# Patient Record
Sex: Female | Born: 1950
Health system: Southern US, Community
[De-identification: ages and names within clinical notes are randomized; demographics above are authoritative.]

## PROBLEM LIST (undated history)

## (undated) DIAGNOSIS — E7800 Pure hypercholesterolemia, unspecified: Secondary | ICD-10-CM

## (undated) DIAGNOSIS — C50919 Malignant neoplasm of unspecified site of unspecified female breast: Secondary | ICD-10-CM

## (undated) DIAGNOSIS — E119 Type 2 diabetes mellitus without complications: Secondary | ICD-10-CM

## (undated) DIAGNOSIS — I10 Essential (primary) hypertension: Secondary | ICD-10-CM

## (undated) DIAGNOSIS — K219 Gastro-esophageal reflux disease without esophagitis: Secondary | ICD-10-CM

## (undated) HISTORY — DX: Essential (primary) hypertension: I10

## (undated) HISTORY — PX: TUBAL LIGATION: SHX77

## (undated) HISTORY — DX: Type 2 diabetes mellitus without complications: E11.9

## (undated) HISTORY — DX: Malignant neoplasm of unspecified site of unspecified female breast: C50.919

## (undated) HISTORY — DX: Pure hypercholesterolemia, unspecified: E78.00

## (undated) HISTORY — DX: Gastro-esophageal reflux disease without esophagitis: K21.9

## (undated) HISTORY — PX: ANKLE ARTHROPLASTY: SUR68

---

## 2010-05-03 HISTORY — PX: ANKLE ARTHROPLASTY: SUR68

## 2016-01-26 ENCOUNTER — Encounter: Payer: Self-pay | Admitting: Hematology

## 2016-05-03 DIAGNOSIS — Z923 Personal history of irradiation: Secondary | ICD-10-CM

## 2016-05-03 HISTORY — DX: Personal history of irradiation: Z92.3

## 2016-10-13 ENCOUNTER — Encounter: Payer: Self-pay | Admitting: Hematology

## 2016-10-28 ENCOUNTER — Encounter: Payer: Self-pay | Admitting: Hematology

## 2016-10-28 DIAGNOSIS — C50919 Malignant neoplasm of unspecified site of unspecified female breast: Secondary | ICD-10-CM

## 2016-10-28 HISTORY — DX: Malignant neoplasm of unspecified site of unspecified female breast: C50.919

## 2016-10-28 HISTORY — PX: BREAST LUMPECTOMY: SHX2

## 2016-11-17 ENCOUNTER — Encounter: Payer: Self-pay | Admitting: Hematology

## 2017-05-20 DIAGNOSIS — I781 Nevus, non-neoplastic: Secondary | ICD-10-CM | POA: Diagnosis not present

## 2017-05-20 DIAGNOSIS — L821 Other seborrheic keratosis: Secondary | ICD-10-CM | POA: Diagnosis not present

## 2017-05-20 DIAGNOSIS — L814 Other melanin hyperpigmentation: Secondary | ICD-10-CM | POA: Diagnosis not present

## 2017-05-20 DIAGNOSIS — D1801 Hemangioma of skin and subcutaneous tissue: Secondary | ICD-10-CM | POA: Diagnosis not present

## 2017-06-01 DIAGNOSIS — N6459 Other signs and symptoms in breast: Secondary | ICD-10-CM | POA: Diagnosis not present

## 2017-06-01 DIAGNOSIS — E7800 Pure hypercholesterolemia, unspecified: Secondary | ICD-10-CM | POA: Diagnosis not present

## 2017-06-01 DIAGNOSIS — E559 Vitamin D deficiency, unspecified: Secondary | ICD-10-CM | POA: Diagnosis not present

## 2017-06-01 DIAGNOSIS — K210 Gastro-esophageal reflux disease with esophagitis: Secondary | ICD-10-CM | POA: Diagnosis not present

## 2017-06-01 DIAGNOSIS — E119 Type 2 diabetes mellitus without complications: Secondary | ICD-10-CM | POA: Diagnosis not present

## 2017-06-01 DIAGNOSIS — I10 Essential (primary) hypertension: Secondary | ICD-10-CM | POA: Diagnosis not present

## 2017-06-01 DIAGNOSIS — Z0000 Encounter for general adult medical examination without abnormal findings: Secondary | ICD-10-CM | POA: Diagnosis not present

## 2017-06-01 DIAGNOSIS — Z6830 Body mass index (BMI) 30.0-30.9, adult: Secondary | ICD-10-CM | POA: Diagnosis not present

## 2017-06-16 DIAGNOSIS — Z1212 Encounter for screening for malignant neoplasm of rectum: Secondary | ICD-10-CM | POA: Diagnosis not present

## 2017-06-16 DIAGNOSIS — Z1211 Encounter for screening for malignant neoplasm of colon: Secondary | ICD-10-CM | POA: Diagnosis not present

## 2017-06-30 DIAGNOSIS — Z79811 Long term (current) use of aromatase inhibitors: Secondary | ICD-10-CM | POA: Diagnosis not present

## 2017-06-30 DIAGNOSIS — C50411 Malignant neoplasm of upper-outer quadrant of right female breast: Secondary | ICD-10-CM | POA: Diagnosis not present

## 2017-07-05 ENCOUNTER — Encounter: Payer: Self-pay | Admitting: Hematology

## 2017-07-05 DIAGNOSIS — N6002 Solitary cyst of left breast: Secondary | ICD-10-CM | POA: Diagnosis not present

## 2017-07-05 DIAGNOSIS — Z853 Personal history of malignant neoplasm of breast: Secondary | ICD-10-CM | POA: Diagnosis not present

## 2017-07-05 DIAGNOSIS — R921 Mammographic calcification found on diagnostic imaging of breast: Secondary | ICD-10-CM | POA: Diagnosis not present

## 2017-07-05 DIAGNOSIS — N6011 Diffuse cystic mastopathy of right breast: Secondary | ICD-10-CM | POA: Diagnosis not present

## 2017-07-06 DIAGNOSIS — E7800 Pure hypercholesterolemia, unspecified: Secondary | ICD-10-CM | POA: Diagnosis not present

## 2017-07-06 DIAGNOSIS — E119 Type 2 diabetes mellitus without complications: Secondary | ICD-10-CM | POA: Diagnosis not present

## 2017-07-06 DIAGNOSIS — Z0000 Encounter for general adult medical examination without abnormal findings: Secondary | ICD-10-CM | POA: Diagnosis not present

## 2017-08-17 DIAGNOSIS — R945 Abnormal results of liver function studies: Secondary | ICD-10-CM | POA: Diagnosis not present

## 2017-08-30 DIAGNOSIS — C50912 Malignant neoplasm of unspecified site of left female breast: Secondary | ICD-10-CM | POA: Diagnosis not present

## 2017-10-07 DIAGNOSIS — C50411 Malignant neoplasm of upper-outer quadrant of right female breast: Secondary | ICD-10-CM | POA: Diagnosis not present

## 2017-10-07 DIAGNOSIS — Z79811 Long term (current) use of aromatase inhibitors: Secondary | ICD-10-CM | POA: Diagnosis not present

## 2017-11-29 DIAGNOSIS — E7800 Pure hypercholesterolemia, unspecified: Secondary | ICD-10-CM | POA: Diagnosis not present

## 2017-11-29 DIAGNOSIS — K210 Gastro-esophageal reflux disease with esophagitis: Secondary | ICD-10-CM | POA: Diagnosis not present

## 2017-11-29 DIAGNOSIS — E559 Vitamin D deficiency, unspecified: Secondary | ICD-10-CM | POA: Diagnosis not present

## 2017-11-29 DIAGNOSIS — I10 Essential (primary) hypertension: Secondary | ICD-10-CM | POA: Diagnosis not present

## 2017-11-29 DIAGNOSIS — N6459 Other signs and symptoms in breast: Secondary | ICD-10-CM | POA: Diagnosis not present

## 2017-11-29 DIAGNOSIS — Z6827 Body mass index (BMI) 27.0-27.9, adult: Secondary | ICD-10-CM | POA: Diagnosis not present

## 2017-11-29 DIAGNOSIS — E119 Type 2 diabetes mellitus without complications: Secondary | ICD-10-CM | POA: Diagnosis not present

## 2018-03-01 ENCOUNTER — Encounter (HOSPITAL_COMMUNITY): Payer: Self-pay | Admitting: Hematology

## 2018-03-01 ENCOUNTER — Inpatient Hospital Stay (HOSPITAL_COMMUNITY): Payer: Medicare Other | Attending: Hematology | Admitting: Hematology

## 2018-03-01 ENCOUNTER — Inpatient Hospital Stay (HOSPITAL_COMMUNITY): Payer: Medicare Other

## 2018-03-01 VITALS — BP 151/84 | HR 82 | Temp 98.2°F | Resp 18 | Ht 62.0 in | Wt 144.5 lb

## 2018-03-01 DIAGNOSIS — Z803 Family history of malignant neoplasm of breast: Secondary | ICD-10-CM | POA: Diagnosis not present

## 2018-03-01 DIAGNOSIS — Z8249 Family history of ischemic heart disease and other diseases of the circulatory system: Secondary | ICD-10-CM | POA: Insufficient documentation

## 2018-03-01 DIAGNOSIS — Z170 Estrogen receptor positive status [ER+]: Secondary | ICD-10-CM | POA: Diagnosis not present

## 2018-03-01 DIAGNOSIS — Z79899 Other long term (current) drug therapy: Secondary | ICD-10-CM | POA: Diagnosis not present

## 2018-03-01 DIAGNOSIS — I10 Essential (primary) hypertension: Secondary | ICD-10-CM | POA: Diagnosis not present

## 2018-03-01 DIAGNOSIS — K219 Gastro-esophageal reflux disease without esophagitis: Secondary | ICD-10-CM | POA: Diagnosis not present

## 2018-03-01 DIAGNOSIS — Z79811 Long term (current) use of aromatase inhibitors: Secondary | ICD-10-CM | POA: Diagnosis not present

## 2018-03-01 DIAGNOSIS — Z853 Personal history of malignant neoplasm of breast: Secondary | ICD-10-CM

## 2018-03-01 DIAGNOSIS — E119 Type 2 diabetes mellitus without complications: Secondary | ICD-10-CM | POA: Insufficient documentation

## 2018-03-01 DIAGNOSIS — Z923 Personal history of irradiation: Secondary | ICD-10-CM | POA: Insufficient documentation

## 2018-03-01 DIAGNOSIS — Z7984 Long term (current) use of oral hypoglycemic drugs: Secondary | ICD-10-CM | POA: Diagnosis not present

## 2018-03-01 DIAGNOSIS — C50411 Malignant neoplasm of upper-outer quadrant of right female breast: Secondary | ICD-10-CM | POA: Diagnosis not present

## 2018-03-01 LAB — COMPREHENSIVE METABOLIC PANEL
ALK PHOS: 49 U/L (ref 38–126)
ALT: 28 U/L (ref 0–44)
AST: 36 U/L (ref 15–41)
Albumin: 4.7 g/dL (ref 3.5–5.0)
Anion gap: 11 (ref 5–15)
BUN: 16 mg/dL (ref 8–23)
CALCIUM: 9.4 mg/dL (ref 8.9–10.3)
CO2: 27 mmol/L (ref 22–32)
CREATININE: 0.72 mg/dL (ref 0.44–1.00)
Chloride: 103 mmol/L (ref 98–111)
GFR calc non Af Amer: >60 mL/min (ref >60)
Glucose, Bld: 85 mg/dL (ref 70–99)
Potassium: 3.8 mmol/L (ref 3.5–5.1)
Sodium: 141 mmol/L (ref 135–145)
Total Bilirubin: 0.9 mg/dL (ref 0.3–1.2)
Total Protein: 8.1 g/dL (ref 6.5–8.1)

## 2018-03-01 LAB — CBC WITH DIFFERENTIAL/PLATELET
ABS IMMATURE GRANULOCYTES: 0.02 10*3/uL (ref 0.00–0.07)
Basophils Absolute: 0 10*3/uL (ref 0.0–0.1)
Basophils Relative: 1 %
Eosinophils Absolute: 0.1 10*3/uL (ref 0.0–0.5)
Eosinophils Relative: 1 %
HEMATOCRIT: 43.4 % (ref 36.0–46.0)
Hemoglobin: 14.1 g/dL (ref 12.0–15.0)
Immature Granulocytes: 0 %
Lymphocytes Relative: 25 %
Lymphs Abs: 1.9 10*3/uL (ref 0.7–4.0)
MCH: 29.3 pg (ref 26.0–34.0)
MCHC: 32.5 g/dL (ref 30.0–36.0)
MCV: 90.2 fL (ref 80.0–100.0)
MONO ABS: 0.6 10*3/uL (ref 0.1–1.0)
Monocytes Relative: 8 %
NEUTROS ABS: 5.1 10*3/uL (ref 1.7–7.7)
Neutrophils Relative %: 65 %
Platelets: 277 10*3/uL (ref 150–400)
RBC: 4.81 MIL/uL (ref 3.87–5.11)
RDW: 13.8 % (ref 11.5–15.5)
WBC: 7.6 10*3/uL (ref 4.0–10.5)
nRBC: 0 % (ref 0.0–0.2)

## 2018-03-01 NOTE — Progress Notes (Signed)
[facility] NOTE  Patient Care Team: Burdine, Virgina Evener, MD as PCP - General (Family Medicine)  CHIEF COMPLAINTS/PURPOSE OF CONSULTATION:  To establish care for her right breast cancer.  HISTORY OF PRESENTING ILLNESS:  Laura Oliver 67 y.o. female is seen in consultation today for establishing care for her right breast cancer.  She recently moved to [place] from [place] on 12/24/2017.  She was diagnosed with left breast cancer in 2018, for which she underwent lumpectomy and sentinel lymph node biopsy on 10/28/2016.  This showed 2 foci of cancer 0.9 cm and 0.4 cm, ER/PR positive and HER-2 negative.  She underwent 16 sessions of radiation therapy followed by anastrozole.  She is tolerating anastrozole very well.  She denies any joint pains.  No muscular pains were noted.  She did have genetic testing for BRCA1/2 done which were negative.  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  Her daughter was diagnosed with breast cancer at age 63.  She was also tested negative for BRCA mutation.  Maternal aunt reportedly had breast cancer in her 37s and died of it.  Otherwise she is very active weekly.  Energy and appetite are reported as 100%.  I reviewed her records extensively and collaborated the history with the patient.    MEDICAL HISTORY:  Past Medical History:  Diagnosis Date  . Breast cancer ([place]) 10/28/2016   left  . Diabetes mellitus without complication ([place])   . GERD (gastroesophageal reflux disease)   . High cholesterol   . Hypertension     SURGICAL HISTORY: Past Surgical History:  Procedure Laterality Date  . ANKLE ARTHROPLASTY    . BREAST LUMPECTOMY Left 10/28/2016  . TUBAL LIGATION      SOCIAL HISTORY: Social History   Socioeconomic History  . Marital status: Married    Spouse name: Not on file  . Number of children: Not on file  . Years of education: Not on file  . Highest education level: Not on file  Occupational  History  . Occupation: retired  Scientific laboratory technician  . Financial resource strain: Not hard at all  . Food insecurity:    Worry: Never true    Inability: Never true  . Transportation needs:    Medical: No    Non-medical: No  Tobacco Use  . Smoking status: Never Smoker  . Smokeless tobacco: Never Used  Substance and Sexual Activity  . Alcohol use: Yes    Frequency: Never    Comment: prn beer  . Drug use: Never  . Sexual activity: Not on file  Lifestyle  . Physical activity:    Days per week: Not on file    Minutes per session: Not on file  . Stress: Not at all  Relationships  . Social connections:    Talks on phone: Not on file    Gets together: Not on file    Attends religious service: Not on file    Active member of club or organization: Not on file    Attends meetings of clubs or organizations: Not on file    Relationship status: Not on file  . Intimate partner violence:    Fear of current or ex partner: Not on file    Emotionally abused: Not on file    Physically abused: Not on file    Forced sexual activity: Not on file  Other Topics Concern  . Not on file  Social History Narrative   From [place]  [place]   Recently retired and moved to [place] to be closer to her family.     FAMILY HISTORY: Family History  Problem Relation Age of Onset  . Cancer Daughter        breast  . Rheumatic fever Father   . Diabetes Brother   . Hypertension Brother     ALLERGIES:  has no allergies on file.  MEDICATIONS:  Current Outpatient Medications  Medication Sig Dispense Refill  . alendronate (FOSAMAX) 70 MG tablet Take 70 mg by mouth once a week. Take with a full glass of water on an empty stomach.    Marland Kitchen anastrozole (ARIMIDEX) 1 MG tablet Take 1 mg by mouth daily.    . Calcium Carbonate (CALTRATE 600 PO) Take 1 tablet by mouth.    . Cholecalciferol (VITAMIN D3) 2000 units TABS Take 1 tablet by mouth.    . ezetimibe-simvastatin (VYTORIN) 10-40 MG tablet Take 1 tablet by mouth daily.     . metFORMIN (GLUCOPHAGE) 500 MG tablet Take by mouth daily with breakfast.    . Multiple Vitamins-Minerals (WOMENS MULTI VITAMIN & MINERAL PO) Take 1 tablet by mouth.    Marland Kitchen omeprazole (PRILOSEC) 20 MG capsule Take 20 mg by mouth daily.    . valsartan-hydrochlorothiazide (DIOVAN-HCT) 320-12.5 MG tablet Take 1 tablet by mouth daily.     No current facility-administered medications for this visit.     REVIEW OF SYSTEMS:   Constitutional: Denies fevers, chills or abnormal night sweats Eyes: Denies blurriness of vision, double vision or watery eyes Ears, nose, mouth, throat, and face: Denies mucositis or sore throat Respiratory: Denies cough, dyspnea or wheezes Cardiovascular: Denies palpitation, chest discomfort or lower extremity swelling Gastrointestinal:  Denies nausea, heartburn or change in bowel habits Skin: Denies abnormal skin rashes Lymphatics: Denies new lymphadenopathy or easy bruising Neurological:Denies numbness, tingling or new weaknesses Behavioral/Psych: Mood is stable, no new changes  Breast:  Denies any palpable lumps or discharge All other systems were reviewed with the patient and are negative.  PHYSICAL EXAMINATION: ECOG PERFORMANCE STATUS: 1 - Symptomatic but completely ambulatory  Vitals:   03/01/18 1257  BP: (!) 151/84  Pulse: 82  Resp: 18  Temp: 98.2 F (36.8 C)  SpO2: 100%   Filed Weights   03/01/18 1257  Weight: 144 lb 8 oz (65.5 kg)    GENERAL:alert, no distress and comfortable SKIN: skin color, texture, turgor are normal, no rashes or significant lesions EYES: normal, conjunctiva are pink and non-injected, sclera clear OROPHARYNX:no exudate, no erythema and lips, buccal mucosa, and tongue normal  NECK: supple, thyroid normal size, non-tender, without nodularity LYMPH:  no palpable lymphadenopathy in the cervical, axillary or inguinal LUNGS: clear to auscultation and percussion with normal breathing effort HEART: regular rate & rhythm and no  murmurs and no lower extremity edema ABDOMEN:abdomen soft, non-tender and normal bowel sounds Musculoskeletal:no cyanosis of digits and no clubbing  PSYCH: alert & oriented x 3 with fluent speech NEURO: no focal motor/sensory deficits BREAST: Lumpectomy scar in the upper outer quadrant is well-healed.  No palpable mass in bilateral breast.  No palpable adenopathy.  LABORATORY DATA:  I have reviewed the data as listed Lab Results  Component Value Date   WBC 7.6 03/01/2018   HGB 14.1 03/01/2018   HCT 43.4 03/01/2018   MCV 90.2 03/01/2018   PLT 277 03/01/2018   Lab Results  Component Value Date   NA 141 03/01/2018   K 3.8 03/01/2018   CL 103 03/01/2018  CO2 27 03/01/2018    RADIOGRAPHIC STUDIES: I have personally reviewed the radiological reports and agreed with the findings in the report.  ASSESSMENT AND PLAN:  History of right breast cancer 1.  Right breast cancer (PT 1BN0), ER/PR positive, HER-2 negative: - Abnormal mammogram in April 2018, biopsy on 09/30/2016 of the left breast at 12:00, consistent with moderately differentiated IDC, ER/PR positive, HER-2 negative - MR showed 11 o'clock position left breast, 1.6 x 1.4 x 2.4 cm irregularly bordered enhancing mass. - She underwent left lumpectomy and sentinel lymph node biopsy on 10/28/2016. -Pathology showed moderately differentiated IDC, 0.9 cm associated with prior biopsy site.  An incidental well-differentiated IDC, 0.4 cm located about 5 mm posterior to the first lesion.  Low-grade DCIS was seen.  3 sentinel lymph nodes were negative.  Margins were negative. - She received 16 radiation treatments at [facility] breast center in [place]. -She was started on anastrozole on 12/24/2017.  Myread my risk panel on 10/13/2016 was negative for BRCA and other genes. -She is tolerating anastrozole very well. -Today's physical examination shows left lumpectomy scar within normal limits with no palpable masses or adenopathy. -We  will schedule her for bilateral diagnostic mammogram.  We have done labs in our office and the chemistries were normal. -She will have follow-up visits once every 4 months during the first 3 years followed by once every 6 months during years 4 and 5.  I did counsel her about exercises 30 minutes daily, most days of the week.  2.  Osteoporosis: - I will obtain the reports of DEXA scan from last year. -She is on Fosamax 70 mg weekly which she is tolerating it very well.  She is also taking calcium and vitamin D supplements. -I have recommended weightbearing exercises.   All questions were answered. The patient knows to call the clinic with any problems, questions or concerns.    Derek Jack, MD 03/01/18

## 2018-03-01 NOTE — Patient Instructions (Signed)
[facility] at [facility] Discharge Instructions     Thank you for choosing [facility] at [facility] to provide your oncology and hematology care.  To afford each patient quality time with our provider, please arrive at least 15 minutes before your scheduled appointment time.   If you have a lab appointment with the Cancer Center please come in thru the  Main Entrance and check in at the main information desk  You need to re-schedule your appointment should you arrive 10 or more minutes late.  We strive to give you quality time with our providers, and arriving late affects you and other patients whose appointments are after yours.  Also, if you no show three or more times for appointments you may be dismissed from the clinic at the providers discretion.     Again, thank you for choosing [facility].  Our hope is that these requests will decrease the amount of time that you wait before being seen by our physicians.       _____________________________________________________________  Should you have questions after your visit to [facility], please contact our office at [phone] between the hours of 8:00 a.m. and 4:30 p.m.  Voicemails left after 4:00 p.m. will not be returned until the following business day.  For prescription refill requests, have your pharmacy contact our office and allow 72 hours.    Cancer Center Support Programs:   > Cancer Support Group  2nd Tuesday of the month 1pm-2pm, Journey Room    

## 2018-03-01 NOTE — Assessment & Plan Note (Addendum)
1.  Right breast cancer (PT 1BN0), ER/PR positive, HER-2 negative: - Abnormal mammogram in April 2018, biopsy on 09/30/2016 of the left breast at 12:00, consistent with moderately differentiated IDC, ER/PR positive, HER-2 negative - MR showed 11 o'clock position left breast, 1.6 x 1.4 x 2.4 cm irregularly bordered enhancing mass. - She underwent left lumpectomy and sentinel lymph node biopsy on 10/28/2016. -Pathology showed moderately differentiated IDC, 0.9 cm associated with prior biopsy site.  An incidental well-differentiated IDC, 0.4 cm located about 5 mm posterior to the first lesion.  Low-grade DCIS was seen.  3 sentinel lymph nodes were negative.  Margins were negative. - She received 16 radiation treatments at [facility] breast center in [place]. -She was started on anastrozole in July 2018.  Myread my risk panel on 10/13/2016 was negative for BRCA and other genes. -She is tolerating anastrozole very well. -Today's physical examination shows left lumpectomy scar within normal limits with no palpable masses or adenopathy. -We will schedule her for bilateral diagnostic mammogram.  We have done labs in our office and the chemistries were normal. -She will have follow-up visits once every 4 months during the first 3 years followed by once every 6 months during years 4 and 5.  I did counsel her about exercises 30 minutes daily, most days of the week.  2.  Osteoporosis: - I will obtain the reports of DEXA scan from last year. -She is on Fosamax 70 mg weekly which she is tolerating it very well.  She is also taking calcium and vitamin D supplements. -I have recommended weightbearing exercises.

## 2018-03-02 DIAGNOSIS — Z23 Encounter for immunization: Secondary | ICD-10-CM | POA: Diagnosis not present

## 2018-03-09 ENCOUNTER — Other Ambulatory Visit (HOSPITAL_COMMUNITY): Payer: Self-pay | Admitting: Nurse Practitioner

## 2018-03-09 DIAGNOSIS — Z853 Personal history of malignant neoplasm of breast: Secondary | ICD-10-CM

## 2018-03-13 ENCOUNTER — Other Ambulatory Visit (HOSPITAL_COMMUNITY): Payer: Self-pay | Admitting: Nurse Practitioner

## 2018-03-13 DIAGNOSIS — Z853 Personal history of malignant neoplasm of breast: Secondary | ICD-10-CM

## 2018-03-21 ENCOUNTER — Ambulatory Visit (HOSPITAL_COMMUNITY): Payer: Medicare Other

## 2018-03-21 ENCOUNTER — Encounter (HOSPITAL_COMMUNITY): Payer: Medicare Other

## 2018-03-21 ENCOUNTER — Encounter (HOSPITAL_COMMUNITY): Payer: Self-pay

## 2018-03-21 ENCOUNTER — Ambulatory Visit (HOSPITAL_COMMUNITY)
Admission: RE | Admit: 2018-03-21 | Discharge: 2018-03-21 | Disposition: A | Discharge status: Home / Self Care | Payer: Medicare Other | Source: Ambulatory Visit | Attending: Nurse Practitioner | Admitting: Nurse Practitioner

## 2018-03-21 ENCOUNTER — Ambulatory Visit (HOSPITAL_COMMUNITY): Admission: RE | Admit: 2018-03-21 | Payer: Medicare Other | Source: Ambulatory Visit

## 2018-03-21 DIAGNOSIS — Z853 Personal history of malignant neoplasm of breast: Secondary | ICD-10-CM

## 2018-04-24 DIAGNOSIS — E782 Mixed hyperlipidemia: Secondary | ICD-10-CM | POA: Diagnosis not present

## 2018-04-24 DIAGNOSIS — E559 Vitamin D deficiency, unspecified: Secondary | ICD-10-CM | POA: Diagnosis not present

## 2018-04-24 DIAGNOSIS — I10 Essential (primary) hypertension: Secondary | ICD-10-CM | POA: Diagnosis not present

## 2018-04-24 DIAGNOSIS — Z6827 Body mass index (BMI) 27.0-27.9, adult: Secondary | ICD-10-CM | POA: Diagnosis not present

## 2018-04-24 DIAGNOSIS — C50911 Malignant neoplasm of unspecified site of right female breast: Secondary | ICD-10-CM | POA: Diagnosis not present

## 2018-04-24 DIAGNOSIS — E119 Type 2 diabetes mellitus without complications: Secondary | ICD-10-CM | POA: Diagnosis not present

## 2018-04-24 DIAGNOSIS — Z800 Family history of malignant neoplasm of digestive organs: Secondary | ICD-10-CM | POA: Diagnosis not present

## 2018-04-24 DIAGNOSIS — Z23 Encounter for immunization: Secondary | ICD-10-CM | POA: Diagnosis not present

## 2018-06-20 ENCOUNTER — Other Ambulatory Visit (HOSPITAL_COMMUNITY): Payer: Self-pay | Admitting: Emergency Medicine

## 2018-06-20 DIAGNOSIS — Z853 Personal history of malignant neoplasm of breast: Secondary | ICD-10-CM

## 2018-06-21 ENCOUNTER — Inpatient Hospital Stay (HOSPITAL_COMMUNITY): Payer: Medicare Other | Attending: Hematology

## 2018-06-21 DIAGNOSIS — E7800 Pure hypercholesterolemia, unspecified: Secondary | ICD-10-CM | POA: Insufficient documentation

## 2018-06-21 DIAGNOSIS — K219 Gastro-esophageal reflux disease without esophagitis: Secondary | ICD-10-CM | POA: Diagnosis not present

## 2018-06-21 DIAGNOSIS — Z79899 Other long term (current) drug therapy: Secondary | ICD-10-CM | POA: Insufficient documentation

## 2018-06-21 DIAGNOSIS — I10 Essential (primary) hypertension: Secondary | ICD-10-CM | POA: Diagnosis not present

## 2018-06-21 DIAGNOSIS — E119 Type 2 diabetes mellitus without complications: Secondary | ICD-10-CM | POA: Insufficient documentation

## 2018-06-21 DIAGNOSIS — Z800 Family history of malignant neoplasm of digestive organs: Secondary | ICD-10-CM | POA: Diagnosis not present

## 2018-06-21 DIAGNOSIS — Z79811 Long term (current) use of aromatase inhibitors: Secondary | ICD-10-CM | POA: Diagnosis not present

## 2018-06-21 DIAGNOSIS — Z7984 Long term (current) use of oral hypoglycemic drugs: Secondary | ICD-10-CM | POA: Insufficient documentation

## 2018-06-21 DIAGNOSIS — Z170 Estrogen receptor positive status [ER+]: Secondary | ICD-10-CM | POA: Diagnosis not present

## 2018-06-21 DIAGNOSIS — Z8249 Family history of ischemic heart disease and other diseases of the circulatory system: Secondary | ICD-10-CM | POA: Insufficient documentation

## 2018-06-21 DIAGNOSIS — Z853 Personal history of malignant neoplasm of breast: Secondary | ICD-10-CM

## 2018-06-21 DIAGNOSIS — Z803 Family history of malignant neoplasm of breast: Secondary | ICD-10-CM | POA: Diagnosis not present

## 2018-06-21 DIAGNOSIS — C50212 Malignant neoplasm of upper-inner quadrant of left female breast: Secondary | ICD-10-CM | POA: Diagnosis not present

## 2018-06-21 DIAGNOSIS — M8580 Other specified disorders of bone density and structure, unspecified site: Secondary | ICD-10-CM | POA: Diagnosis not present

## 2018-06-21 LAB — CBC WITH DIFFERENTIAL/PLATELET
Abs Immature Granulocytes: 0.02 10*3/uL (ref 0.00–0.07)
Basophils Absolute: 0.1 10*3/uL (ref 0.0–0.1)
Basophils Relative: 1 %
Eosinophils Absolute: 0.1 10*3/uL (ref 0.0–0.5)
Eosinophils Relative: 1 %
HCT: 43.1 % (ref 36.0–46.0)
Hemoglobin: 13.9 g/dL (ref 12.0–15.0)
Immature Granulocytes: 0 %
Lymphocytes Relative: 27 %
Lymphs Abs: 1.9 10*3/uL (ref 0.7–4.0)
MCH: 29.2 pg (ref 26.0–34.0)
MCHC: 32.3 g/dL (ref 30.0–36.0)
MCV: 90.5 fL (ref 80.0–100.0)
MONOS PCT: 7 %
Monocytes Absolute: 0.5 10*3/uL (ref 0.1–1.0)
Neutro Abs: 4.7 10*3/uL (ref 1.7–7.7)
Neutrophils Relative %: 64 %
Platelets: 267 10*3/uL (ref 150–400)
RBC: 4.76 MIL/uL (ref 3.87–5.11)
RDW: 13.8 % (ref 11.5–15.5)
WBC: 7.3 10*3/uL (ref 4.0–10.5)
nRBC: 0 % (ref 0.0–0.2)

## 2018-06-21 LAB — COMPREHENSIVE METABOLIC PANEL
ALT: 34 U/L (ref 0–44)
AST: 38 U/L (ref 15–41)
Albumin: 4.6 g/dL (ref 3.5–5.0)
Alkaline Phosphatase: 43 U/L (ref 38–126)
Anion gap: 10 (ref 5–15)
BUN: 16 mg/dL (ref 8–23)
CALCIUM: 9.4 mg/dL (ref 8.9–10.3)
CO2: 27 mmol/L (ref 22–32)
CREATININE: 0.63 mg/dL (ref 0.44–1.00)
Chloride: 103 mmol/L (ref 98–111)
GFR calc Af Amer: >60 mL/min (ref >60)
Glucose, Bld: 107 mg/dL — ABNORMAL HIGH (ref 70–99)
Potassium: 3.9 mmol/L (ref 3.5–5.1)
Sodium: 140 mmol/L (ref 135–145)
Total Bilirubin: 0.7 mg/dL (ref 0.3–1.2)
Total Protein: 8 g/dL (ref 6.5–8.1)

## 2018-06-28 ENCOUNTER — Inpatient Hospital Stay (HOSPITAL_BASED_OUTPATIENT_CLINIC_OR_DEPARTMENT_OTHER): Payer: Medicare Other | Admitting: Hematology

## 2018-06-28 ENCOUNTER — Encounter (HOSPITAL_COMMUNITY): Payer: Self-pay | Admitting: Hematology

## 2018-06-28 VITALS — BP 156/75 | HR 78 | Temp 98.1°F | Resp 18 | Wt 155.2 lb

## 2018-06-28 DIAGNOSIS — Z79811 Long term (current) use of aromatase inhibitors: Secondary | ICD-10-CM | POA: Diagnosis not present

## 2018-06-28 DIAGNOSIS — Z170 Estrogen receptor positive status [ER+]: Secondary | ICD-10-CM | POA: Diagnosis not present

## 2018-06-28 DIAGNOSIS — M8580 Other specified disorders of bone density and structure, unspecified site: Secondary | ICD-10-CM | POA: Diagnosis not present

## 2018-06-28 DIAGNOSIS — E119 Type 2 diabetes mellitus without complications: Secondary | ICD-10-CM | POA: Diagnosis not present

## 2018-06-28 DIAGNOSIS — Z7984 Long term (current) use of oral hypoglycemic drugs: Secondary | ICD-10-CM | POA: Diagnosis not present

## 2018-06-28 DIAGNOSIS — C50212 Malignant neoplasm of upper-inner quadrant of left female breast: Secondary | ICD-10-CM | POA: Diagnosis not present

## 2018-06-28 DIAGNOSIS — K219 Gastro-esophageal reflux disease without esophagitis: Secondary | ICD-10-CM

## 2018-06-28 DIAGNOSIS — E7800 Pure hypercholesterolemia, unspecified: Secondary | ICD-10-CM | POA: Diagnosis not present

## 2018-06-28 DIAGNOSIS — Z8249 Family history of ischemic heart disease and other diseases of the circulatory system: Secondary | ICD-10-CM

## 2018-06-28 DIAGNOSIS — I10 Essential (primary) hypertension: Secondary | ICD-10-CM | POA: Diagnosis not present

## 2018-06-28 DIAGNOSIS — Z800 Family history of malignant neoplasm of digestive organs: Secondary | ICD-10-CM | POA: Diagnosis not present

## 2018-06-28 DIAGNOSIS — Z853 Personal history of malignant neoplasm of breast: Secondary | ICD-10-CM | POA: Insufficient documentation

## 2018-06-28 DIAGNOSIS — Z803 Family history of malignant neoplasm of breast: Secondary | ICD-10-CM

## 2018-06-28 DIAGNOSIS — Z79899 Other long term (current) drug therapy: Secondary | ICD-10-CM | POA: Diagnosis not present

## 2018-06-28 NOTE — Progress Notes (Signed)
[place], [place] [address]   CLINIC:  Medical Oncology/Hematology  PCP:  Curlene Labrum, MD [place] [address] [phone]   REASON FOR VISIT: Follow-up for left breast cancer  CURRENT THERAPY: Arimidex    INTERVAL HISTORY:  Laura Oliver 68 y.o. female returns for routine follow-up for left breast cancer. She reports she has been taking her Arimidex daily without any problems or side effects. Denies any nausea, vomiting, or diarrhea. Denies any new pains. Had not noticed any recent bleeding such as epistaxis, hematuria or hematochezia. Denies recent chest pain on exertion, shortness of breath on minimal exertion, pre-syncopal episodes, or palpitations. Denies any numbness or tingling in hands or feet. Denies any recent fevers, infections, or recent hospitalizations. Patient reports appetite at 100% and energy level at 100%. She is eating well and maintaining her weight.    REVIEW OF SYSTEMS:  Review of Systems  All other systems reviewed and are negative.    PAST MEDICAL/SURGICAL HISTORY:  Past Medical History:  Diagnosis Date  . Breast cancer ([place]) 10/28/2016   left  . Diabetes mellitus without complication ([place])   . GERD (gastroesophageal reflux disease)   . High cholesterol   . Hypertension    Past Surgical History:  Procedure Laterality Date  . ANKLE ARTHROPLASTY    . BREAST LUMPECTOMY Left 10/28/2016  . TUBAL LIGATION       SOCIAL HISTORY:  Social History   Socioeconomic History  . Marital status: Married    Spouse name: Not on file  . Number of children: Not on file  . Years of education: Not on file  . Highest education level: Not on file  Occupational History  . Occupation: retired  Scientific laboratory technician  . Financial resource strain: Not hard at all  . Food insecurity:    Worry: Never true    Inability: Never true  . Transportation needs:    Medical: No    Non-medical: No  Tobacco Use  . Smoking  status: Never Smoker  . Smokeless tobacco: Never Used  Substance and Sexual Activity  . Alcohol use: Yes    Frequency: Never    Comment: prn beer  . Drug use: Never  . Sexual activity: Not on file  Lifestyle  . Physical activity:    Days per week: Not on file    Minutes per session: Not on file  . Stress: Not at all  Relationships  . Social connections:    Talks on phone: Not on file    Gets together: Not on file    Attends religious service: Not on file    Active member of club or organization: Not on file    Attends meetings of clubs or organizations: Not on file    Relationship status: Not on file  . Intimate partner violence:    Fear of current or ex partner: Not on file    Emotionally abused: Not on file    Physically abused: Not on file    Forced sexual activity: Not on file  Other Topics Concern  . Not on file  Social History Narrative   From [place]   Recently retired and moved to [place] to be closer to her family.     FAMILY HISTORY:  Family History  Problem Relation Age of Onset  . Cancer Daughter        breast  . Rheumatic fever Father   . Diabetes Brother   .  Hypertension Brother     CURRENT MEDICATIONS:  Outpatient Encounter Medications as of 06/28/2018  Medication Sig  . alendronate (FOSAMAX) 70 MG tablet Take 70 mg by mouth once a week. Take with a full glass of water on an empty stomach.  Marland Kitchen anastrozole (ARIMIDEX) 1 MG tablet Take 1 mg by mouth daily.  . Calcium Carbonate (CALTRATE 600 PO) Take 1 tablet by mouth.  . Cholecalciferol (VITAMIN D3) 2000 units TABS Take 1 tablet by mouth.  . ezetimibe-simvastatin (VYTORIN) 10-40 MG tablet Take 1 tablet by mouth daily.  . metFORMIN (GLUCOPHAGE) 500 MG tablet Take by mouth daily with breakfast.  . Multiple Vitamins-Minerals (WOMENS MULTI VITAMIN & MINERAL PO) Take 1 tablet by mouth.  Marland Kitchen omeprazole (PRILOSEC) 20 MG capsule Take 20 mg by mouth daily.  . valsartan-hydrochlorothiazide (DIOVAN-HCT) 320-12.5 MG  tablet Take 1 tablet by mouth daily.   No facility-administered encounter medications on file as of 06/28/2018.     ALLERGIES:  Not on File   PHYSICAL EXAM:  ECOG Performance status: 1  Vitals:   06/28/18 0806  BP: (!) 156/75  Pulse: 78  Resp: 18  Temp: 98.1 F (36.7 C)  SpO2: 100%   Filed Weights   06/28/18 0806  Weight: 155 lb 3.2 oz (70.4 kg)    Physical Exam Constitutional:      Appearance: Normal appearance. She is normal weight.  Cardiovascular:     Rate and Rhythm: Normal rate and regular rhythm.     Heart sounds: Normal heart sounds.  Pulmonary:     Effort: Pulmonary effort is normal.     Breath sounds: Normal breath sounds.  Musculoskeletal: Normal range of motion.  Skin:    General: Skin is warm and dry.  Neurological:     Mental Status: She is alert and oriented to person, place, and time. Mental status is at baseline.  Psychiatric:        Mood and Affect: Mood normal.        Behavior: Behavior normal.        Thought Content: Thought content normal.        Judgment: Judgment normal.   Breast: No palpable masses, no skin changes or nipple discharge, no adenopathy. Left: lumpectomy scar has mild thickness but no redness, swelling, or pain    LABORATORY DATA:  I have reviewed the labs as listed.  CBC    Component Value Date/Time   WBC 7.3 06/21/2018 1047   RBC 4.76 06/21/2018 1047   HGB 13.9 06/21/2018 1047   HCT 43.1 06/21/2018 1047   PLT 267 06/21/2018 1047   MCV 90.5 06/21/2018 1047   MCH 29.2 06/21/2018 1047   MCHC 32.3 06/21/2018 1047   RDW 13.8 06/21/2018 1047   LYMPHSABS 1.9 06/21/2018 1047   MONOABS 0.5 06/21/2018 1047   EOSABS 0.1 06/21/2018 1047   BASOSABS 0.1 06/21/2018 1047   CMP Latest Ref Rng & Units 06/21/2018 03/01/2018  Glucose 70 - 99 mg/dL 107(H) 85  BUN 8 - 23 mg/dL 16 16  Creatinine 0.44 - 1.00 mg/dL 0.63 0.72  Sodium 135 - 145 mmol/L 140 141  Potassium 3.5 - 5.1 mmol/L 3.9 3.8  Chloride 98 - 111 mmol/L 103 103    CO2 22 - 32 mmol/L 27 27  Calcium 8.9 - 10.3 mg/dL 9.4 9.4  Total Protein 6.5 - 8.1 g/dL 8.0 8.1  Total Bilirubin 0.3 - 1.2 mg/dL 0.7 0.9  Alkaline Phos 38 - 126 U/L 43 49  AST 15 - 41  U/L 38 36  ALT 0 - 44 U/L 34 28       DIAGNOSTIC IMAGING:  I have independently reviewed the scans and discussed with the patient.   I have reviewed Francene Finders, NP's note and agree with the documentation.  I personally performed a face-to-face visit, made revisions and my assessment and plan is as follows.    ASSESSMENT & PLAN:   History of left breast cancer 1.  Left breast cancer (PT1bN0), multifocal, ER/PR positive, HER-2 negative: - Abnormal mammogram in April 2018, biopsy on 09/30/2016 of the left breast at 12:00, consistent with moderately differentiated IDC, ER/PR positive, HER-2 negative - MR showed 11 o'clock position left breast, 1.6 x 1.4 x 2.4 cm irregularly bordered enhancing mass. - She underwent left lumpectomy and sentinel lymph node biopsy on 10/28/2016. -Pathology showed moderately differentiated IDC, 0.9 cm associated with prior biopsy site.  An incidental well-differentiated IDC, 0.4 cm located about 5 mm posterior to the first lesion.  Low-grade DCIS was seen.  3 sentinel lymph nodes were negative.  Margins were negative. - She received 16 radiation treatments at [facility] breast center in [place]. -Anastrozole was started in July 2018.   - Today's physical examination did not reveal any changes in both breasts.  Left breast lumpectomy site is unchanged.  There is thickening felt below the scar.  No palpable adenopathy in bilateral axillae. - I reviewed her blood work which was within normal limits.  We will schedule her mammogram after 07/06/2018. -She will have follow-up visits every 4 months for the first 3 years followed by every 6 months for subsequent 2 years.  2.  Osteopenia: - Bone density test on 11/17/2016 shows T score of -2.4. -She was started on Fosamax  70 mg weekly by her PMD in [place].  She is tolerating it well. -She will continue calcium and vitamin D supplements.  I plan to repeat DEXA scan in July 2020.  3.  Family history: - Her daughter was diagnosed with breast cancer at age 48.  Maternal aunt had breast cancer. -Father had colon cancer. - Myriad myRisk testing on 10/13/2016 was negative.      Orders placed this encounter:  Orders Placed This Encounter  Procedures  . MM Digital Diagnostic Bilat  . US Breast Limited Uni Left Inc Axilla  . US Breast Limited Uni Right Inc Axilla  . DG Bone Density  . CBC with Differential/Platelet  . Comprehensive metabolic panel  . VITAMIN D 25 Hydroxy (Vit-D Deficiency, Fractures)      Derek Jack, MD [place] [phone]

## 2018-06-28 NOTE — Patient Instructions (Signed)
[facility] at [facility] Discharge Instructions     Thank you for choosing [facility] at [facility] to provide your oncology and hematology care.  To afford each patient quality time with our provider, please arrive at least 15 minutes before your scheduled appointment time.   If you have a lab appointment with the Cancer Center please come in thru the  Main Entrance and check in at the main information desk  You need to re-schedule your appointment should you arrive 10 or more minutes late.  We strive to give you quality time with our providers, and arriving late affects you and other patients whose appointments are after yours.  Also, if you no show three or more times for appointments you may be dismissed from the clinic at the providers discretion.     Again, thank you for choosing [facility].  Our hope is that these requests will decrease the amount of time that you wait before being seen by our physicians.       _____________________________________________________________  Should you have questions after your visit to [facility], please contact our office at [phone] between the hours of 8:00 a.m. and 4:30 p.m.  Voicemails left after 4:00 p.m. will not be returned until the following business day.  For prescription refill requests, have your pharmacy contact our office and allow 72 hours.    Cancer Center Support Programs:   > Cancer Support Group  2nd Tuesday of the month 1pm-2pm, Journey Room    

## 2018-06-28 NOTE — Assessment & Plan Note (Addendum)
1.  Left breast cancer (PT1bN0), multifocal, ER/PR positive, HER-2 negative: - Abnormal mammogram in April 2018, biopsy on 09/30/2016 of the left breast at 12:00, consistent with moderately differentiated IDC, ER/PR positive, HER-2 negative - MR showed 11 o'clock position left breast, 1.6 x 1.4 x 2.4 cm irregularly bordered enhancing mass. - She underwent left lumpectomy and sentinel lymph node biopsy on 10/28/2016. -Pathology showed moderately differentiated IDC, 0.9 cm associated with prior biopsy site.  An incidental well-differentiated IDC, 0.4 cm located about 5 mm posterior to the first lesion.  Low-grade DCIS was seen.  3 sentinel lymph nodes were negative.  Margins were negative. - She received 16 radiation treatments at [facility] center in [place]. -Anastrozole was started in July 2018.   - Today's physical examination did not reveal any changes in both breasts.  Left breast lumpectomy site is unchanged.  There is thickening felt below the scar.  No palpable adenopathy in bilateral axillae. - I reviewed her blood work which was within normal limits.  We will schedule her mammogram after 07/06/2018. -She will have follow-up visits every 4 months for the first 3 years followed by every 6 months for subsequent 2 years.  2.  Osteopenia: - Bone density test on 11/17/2016 shows T score of -2.4. -She was started on Fosamax 70 mg weekly by her PMD in [place].  She is tolerating it well. -She will continue calcium and vitamin D supplements.  I plan to repeat DEXA scan in July 2020.  3.  Family history: - Her daughter was diagnosed with breast cancer at age 45.  Maternal aunt had breast cancer. -Father had colon cancer. - Myriad myRisk testing on 10/13/2016 was negative. 

## 2018-07-06 ENCOUNTER — Other Ambulatory Visit (HOSPITAL_COMMUNITY): Payer: Self-pay | Admitting: Nurse Practitioner

## 2018-07-06 DIAGNOSIS — Z853 Personal history of malignant neoplasm of breast: Secondary | ICD-10-CM

## 2018-07-18 ENCOUNTER — Ambulatory Visit (HOSPITAL_COMMUNITY): Payer: Medicare Other

## 2018-07-18 ENCOUNTER — Encounter (HOSPITAL_COMMUNITY): Payer: Medicare Other

## 2018-07-18 ENCOUNTER — Ambulatory Visit (HOSPITAL_COMMUNITY)
Admission: RE | Admit: 2018-07-18 | Discharge: 2018-07-18 | Disposition: A | Discharge status: Home / Self Care | Payer: Medicare Other | Source: Ambulatory Visit | Attending: Nurse Practitioner | Admitting: Nurse Practitioner

## 2018-07-18 ENCOUNTER — Other Ambulatory Visit: Payer: Self-pay

## 2018-07-18 ENCOUNTER — Encounter (HOSPITAL_COMMUNITY): Payer: Self-pay

## 2018-07-18 DIAGNOSIS — Z853 Personal history of malignant neoplasm of breast: Secondary | ICD-10-CM | POA: Insufficient documentation

## 2018-07-18 DIAGNOSIS — R928 Other abnormal and inconclusive findings on diagnostic imaging of breast: Secondary | ICD-10-CM | POA: Diagnosis not present

## 2018-09-14 ENCOUNTER — Encounter (HOSPITAL_COMMUNITY): Payer: Self-pay | Admitting: Nurse Practitioner

## 2018-09-14 ENCOUNTER — Other Ambulatory Visit (HOSPITAL_COMMUNITY): Payer: Self-pay | Admitting: *Deleted

## 2018-09-14 MED ORDER — ANASTROZOLE 1 MG PO TABS: 1.0000 mg | ORAL_TABLET | Freq: Every day | ORAL | 11 refills | Status: DC

## 2018-10-13 DIAGNOSIS — I10 Essential (primary) hypertension: Secondary | ICD-10-CM | POA: Diagnosis not present

## 2018-10-13 DIAGNOSIS — E782 Mixed hyperlipidemia: Secondary | ICD-10-CM | POA: Diagnosis not present

## 2018-10-13 DIAGNOSIS — K219 Gastro-esophageal reflux disease without esophagitis: Secondary | ICD-10-CM | POA: Diagnosis not present

## 2018-10-13 DIAGNOSIS — E119 Type 2 diabetes mellitus without complications: Secondary | ICD-10-CM | POA: Diagnosis not present

## 2018-11-07 DIAGNOSIS — I10 Essential (primary) hypertension: Secondary | ICD-10-CM | POA: Diagnosis not present

## 2018-11-07 DIAGNOSIS — E1169 Type 2 diabetes mellitus with other specified complication: Secondary | ICD-10-CM | POA: Diagnosis not present

## 2018-11-07 DIAGNOSIS — Z6828 Body mass index (BMI) 28.0-28.9, adult: Secondary | ICD-10-CM | POA: Diagnosis not present

## 2018-11-07 DIAGNOSIS — C50911 Malignant neoplasm of unspecified site of right female breast: Secondary | ICD-10-CM | POA: Diagnosis not present

## 2018-11-07 DIAGNOSIS — E559 Vitamin D deficiency, unspecified: Secondary | ICD-10-CM | POA: Diagnosis not present

## 2018-11-07 DIAGNOSIS — Z0000 Encounter for general adult medical examination without abnormal findings: Secondary | ICD-10-CM | POA: Diagnosis not present

## 2018-11-07 DIAGNOSIS — Z800 Family history of malignant neoplasm of digestive organs: Secondary | ICD-10-CM | POA: Diagnosis not present

## 2018-11-07 DIAGNOSIS — E782 Mixed hyperlipidemia: Secondary | ICD-10-CM | POA: Diagnosis not present

## 2018-11-20 ENCOUNTER — Other Ambulatory Visit: Payer: Self-pay

## 2018-11-20 ENCOUNTER — Ambulatory Visit (HOSPITAL_COMMUNITY)
Admission: RE | Admit: 2018-11-20 | Discharge: 2018-11-20 | Disposition: A | Discharge status: Home / Self Care | Payer: Medicare Other | Source: Ambulatory Visit | Attending: Nurse Practitioner | Admitting: Nurse Practitioner

## 2018-11-20 ENCOUNTER — Inpatient Hospital Stay (HOSPITAL_COMMUNITY): Payer: Medicare Other | Attending: Hematology

## 2018-11-20 DIAGNOSIS — E7800 Pure hypercholesterolemia, unspecified: Secondary | ICD-10-CM | POA: Insufficient documentation

## 2018-11-20 DIAGNOSIS — Z13820 Encounter for screening for osteoporosis: Secondary | ICD-10-CM | POA: Diagnosis not present

## 2018-11-20 DIAGNOSIS — M8580 Other specified disorders of bone density and structure, unspecified site: Secondary | ICD-10-CM | POA: Insufficient documentation

## 2018-11-20 DIAGNOSIS — Z7984 Long term (current) use of oral hypoglycemic drugs: Secondary | ICD-10-CM | POA: Insufficient documentation

## 2018-11-20 DIAGNOSIS — Z79811 Long term (current) use of aromatase inhibitors: Secondary | ICD-10-CM | POA: Insufficient documentation

## 2018-11-20 DIAGNOSIS — Z170 Estrogen receptor positive status [ER+]: Secondary | ICD-10-CM | POA: Diagnosis not present

## 2018-11-20 DIAGNOSIS — M8589 Other specified disorders of bone density and structure, multiple sites: Secondary | ICD-10-CM | POA: Insufficient documentation

## 2018-11-20 DIAGNOSIS — Z79899 Other long term (current) drug therapy: Secondary | ICD-10-CM | POA: Insufficient documentation

## 2018-11-20 DIAGNOSIS — M8588 Other specified disorders of bone density and structure, other site: Secondary | ICD-10-CM | POA: Diagnosis not present

## 2018-11-20 DIAGNOSIS — C50912 Malignant neoplasm of unspecified site of left female breast: Secondary | ICD-10-CM | POA: Diagnosis not present

## 2018-11-20 DIAGNOSIS — Z853 Personal history of malignant neoplasm of breast: Secondary | ICD-10-CM | POA: Insufficient documentation

## 2018-11-20 DIAGNOSIS — K219 Gastro-esophageal reflux disease without esophagitis: Secondary | ICD-10-CM | POA: Insufficient documentation

## 2018-11-20 DIAGNOSIS — E119 Type 2 diabetes mellitus without complications: Secondary | ICD-10-CM | POA: Insufficient documentation

## 2018-11-20 DIAGNOSIS — I10 Essential (primary) hypertension: Secondary | ICD-10-CM | POA: Diagnosis not present

## 2018-11-20 DIAGNOSIS — Z8249 Family history of ischemic heart disease and other diseases of the circulatory system: Secondary | ICD-10-CM | POA: Insufficient documentation

## 2018-11-20 DIAGNOSIS — M85852 Other specified disorders of bone density and structure, left thigh: Secondary | ICD-10-CM | POA: Diagnosis not present

## 2018-11-20 LAB — COMPREHENSIVE METABOLIC PANEL
ALT: 28 U/L (ref 0–44)
AST: 29 U/L (ref 15–41)
Albumin: 4.3 g/dL (ref 3.5–5.0)
Alkaline Phosphatase: 51 U/L (ref 38–126)
Anion gap: 10 (ref 5–15)
BUN: 18 mg/dL (ref 8–23)
CO2: 28 mmol/L (ref 22–32)
Calcium: 9.6 mg/dL (ref 8.9–10.3)
Chloride: 101 mmol/L (ref 98–111)
Creatinine, Ser: 0.7 mg/dL (ref 0.44–1.00)
GFR calc Af Amer: >60 mL/min (ref >60)
GFR calc non Af Amer: >60 mL/min (ref >60)
Glucose, Bld: 113 mg/dL — ABNORMAL HIGH (ref 70–99)
Potassium: 4.3 mmol/L (ref 3.5–5.1)
Sodium: 139 mmol/L (ref 135–145)
Total Bilirubin: 0.7 mg/dL (ref 0.3–1.2)
Total Protein: 7.7 g/dL (ref 6.5–8.1)

## 2018-11-20 LAB — CBC WITH DIFFERENTIAL/PLATELET
Abs Immature Granulocytes: 0.05 10*3/uL (ref 0.00–0.07)
Basophils Absolute: 0.1 10*3/uL (ref 0.0–0.1)
Basophils Relative: 1 %
Eosinophils Absolute: 0.2 10*3/uL (ref 0.0–0.5)
Eosinophils Relative: 2 %
HCT: 42 % (ref 36.0–46.0)
Hemoglobin: 13.7 g/dL (ref 12.0–15.0)
Immature Granulocytes: 1 %
Lymphocytes Relative: 29 %
Lymphs Abs: 1.9 10*3/uL (ref 0.7–4.0)
MCH: 29.4 pg (ref 26.0–34.0)
MCHC: 32.6 g/dL (ref 30.0–36.0)
MCV: 90.1 fL (ref 80.0–100.0)
Monocytes Absolute: 0.6 10*3/uL (ref 0.1–1.0)
Monocytes Relative: 9 %
Neutro Abs: 3.7 10*3/uL (ref 1.7–7.7)
Neutrophils Relative %: 58 %
Platelets: 270 10*3/uL (ref 150–400)
RBC: 4.66 MIL/uL (ref 3.87–5.11)
RDW: 13.7 % (ref 11.5–15.5)
WBC: 6.4 10*3/uL (ref 4.0–10.5)
nRBC: 0 % (ref 0.0–0.2)

## 2018-11-21 ENCOUNTER — Inpatient Hospital Stay (HOSPITAL_BASED_OUTPATIENT_CLINIC_OR_DEPARTMENT_OTHER): Payer: Medicare Other | Admitting: Nurse Practitioner

## 2018-11-21 DIAGNOSIS — M8580 Other specified disorders of bone density and structure, unspecified site: Secondary | ICD-10-CM | POA: Diagnosis not present

## 2018-11-21 DIAGNOSIS — Z170 Estrogen receptor positive status [ER+]: Secondary | ICD-10-CM | POA: Diagnosis not present

## 2018-11-21 DIAGNOSIS — C50912 Malignant neoplasm of unspecified site of left female breast: Secondary | ICD-10-CM

## 2018-11-21 DIAGNOSIS — E7800 Pure hypercholesterolemia, unspecified: Secondary | ICD-10-CM

## 2018-11-21 DIAGNOSIS — E119 Type 2 diabetes mellitus without complications: Secondary | ICD-10-CM

## 2018-11-21 DIAGNOSIS — Z7984 Long term (current) use of oral hypoglycemic drugs: Secondary | ICD-10-CM | POA: Diagnosis not present

## 2018-11-21 DIAGNOSIS — Z853 Personal history of malignant neoplasm of breast: Secondary | ICD-10-CM

## 2018-11-21 DIAGNOSIS — K219 Gastro-esophageal reflux disease without esophagitis: Secondary | ICD-10-CM

## 2018-11-21 DIAGNOSIS — I10 Essential (primary) hypertension: Secondary | ICD-10-CM | POA: Diagnosis not present

## 2018-11-21 DIAGNOSIS — Z8249 Family history of ischemic heart disease and other diseases of the circulatory system: Secondary | ICD-10-CM | POA: Diagnosis not present

## 2018-11-21 DIAGNOSIS — Z79899 Other long term (current) drug therapy: Secondary | ICD-10-CM

## 2018-11-21 DIAGNOSIS — Z79811 Long term (current) use of aromatase inhibitors: Secondary | ICD-10-CM

## 2018-11-21 LAB — VITAMIN D 25 HYDROXY (VIT D DEFICIENCY, FRACTURES): Vit D, 25-Hydroxy: 55.7 ng/mL (ref 30.0–100.0)

## 2018-11-21 NOTE — Assessment & Plan Note (Signed)
1. Left breast cancer: - Abnormal mammogram on April 2018. -Biopsy on 09/30/2016 of the left breast at 12 o'clock position.  Consistent with moderately differentiated IDC, ER/PR positive, HER-2 negative. - MRI showed 11 o'clock position left breast, 1.6 x 1.4 x 2.4 cm irregularly bordered enhancing mass. -She underwent left lumpectomy and sentinel lymph node biopsy on 10/28/2016. -Pathology showed moderately differentiated IDC, 0.9 cm associated with prior biopsy site.  An incidental well-differentiated IDC, 0.4 cm located about 5 mm posterior to the first lesion.  Low-grade DCIS was seen.  3 sentinel lymph nodes were negative.  Margins were negative. -She received 16 radiation treatments at [facility] breast center and in [place]. -Anastrozole was started in July 2018. -Today's physical examination did not reveal any changes in either breast.  Left breast lumpectomy site is unchanged.  There is a thickening felt below the scar.  No palpable adenopathy in bilateral axillary. -Labs on 11/20/2018 showed hemoglobin 13.7, WBC 6.4, platelets 270, potassium 4.3, creatinine 0.70 -Her last mammogram was on 07/18/2018 showed B RADS category 2 benign. -She will have follow-up visits every 4 months for the first 3 years followed by every 6 months for the next 2 years.  2.  Osteopenia: -Bone density test on 11/17/2016 showed T score of -2.4. -She was started on Fosamax 70 mg weekly by her PMD in [place]. She is tolerating it well. -She will continue to take calcium and vitamin D supplements daily -We repeated her DEXA scan on 11/20/2018 which showed a T score of -2.2.

## 2018-11-21 NOTE — Patient Instructions (Addendum)
[facility] at [facility]  Discharge Instructions: Follow up in 4 months with labs   You saw Randi Lockamy, NP, today. _______________________________________________________________  Thank you for choosing [facility] at [facility] to provide your oncology and hematology care.  To afford each patient quality time with our providers, please arrive at least 15 minutes before your scheduled appointment.  You need to re-schedule your appointment if you arrive 10 or more minutes late.  We strive to give you quality time with our providers, and arriving late affects you and other patients whose appointments are after yours.  Also, if you no show three or more times for appointments you may be dismissed from the clinic.  Again, thank you for choosing [facility] at [facility]. Our hope is that these requests will allow you access to exceptional care and in a timely manner. _______________________________________________________________  If you have questions after your visit, please contact our office at [phone] between the hours of 8:30 a.m. and 5:00 p.m. Voicemails left after 4:30 p.m. will not be returned until the following business day. _______________________________________________________________  For prescription refill requests, have your pharmacy contact our office. _______________________________________________________________  Recommendations made by the consultant and any test results will be sent to your referring physician. _______________________________________________________________ 

## 2018-11-21 NOTE — Progress Notes (Signed)
[place], [place] [address]   CLINIC:  Medical Oncology/Hematology  PCP:  Curlene Labrum, MD [place] [address] [phone]   REASON FOR VISIT: Follow-up for left breast cancer  CURRENT THERAPY: Arimidex daily.   INTERVAL HISTORY:  Ms. Thornell 68 y.o. female returns for routine follow-up for left breast cancer.  Patient reports she has been feeling great since her last visit.  She is tolerating the Arimidex well with no side effects. Denies any nausea, vomiting, or diarrhea. Denies any new pains. Had not noticed any recent bleeding such as epistaxis, hematuria or hematochezia. Denies recent chest pain on exertion, shortness of breath on minimal exertion, pre-syncopal episodes, or palpitations. Denies any numbness or tingling in hands or feet. Denies any recent fevers, infections, or recent hospitalizations. Patient reports appetite at 100% and energy level at 75%. She is eating well and maintaining her weight at this time.     REVIEW OF SYSTEMS:  Review of Systems  All other systems reviewed and are negative.    PAST MEDICAL/SURGICAL HISTORY:  Past Medical History:  Diagnosis Date  . Breast cancer ([place]) 10/28/2016   left  . Diabetes mellitus without complication ([place])   . GERD (gastroesophageal reflux disease)   . High cholesterol   . Hypertension    Past Surgical History:  Procedure Laterality Date  . ANKLE ARTHROPLASTY    . BREAST LUMPECTOMY Left 10/28/2016  . TUBAL LIGATION       SOCIAL HISTORY:  Social History   Socioeconomic History  . Marital status: Married    Spouse name: Not on file  . Number of children: Not on file  . Years of education: Not on file  . Highest education level: Not on file  Occupational History  . Occupation: retired  Scientific laboratory technician  . Financial resource strain: Not hard at all  . Food insecurity    Worry: Never true    Inability: Never true  . Transportation needs   Medical: No    Non-medical: No  Tobacco Use  . Smoking status: Never Smoker  . Smokeless tobacco: Never Used  Substance and Sexual Activity  . Alcohol use: Yes    Frequency: Never    Comment: prn beer  . Drug use: Never  . Sexual activity: Not on file  Lifestyle  . Physical activity    Days per week: Not on file    Minutes per session: Not on file  . Stress: Not at all  Relationships  . Social Herbalist on phone: Not on file    Gets together: Not on file    Attends religious service: Not on file    Active member of club or organization: Not on file    Attends meetings of clubs or organizations: Not on file    Relationship status: Not on file  . Intimate partner violence    Fear of current or ex partner: Not on file    Emotionally abused: Not on file    Physically abused: Not on file    Forced sexual activity: Not on file  Other Topics Concern  . Not on file  Social History Narrative   From [place]   Recently retired and moved to [place] to be closer to her family.     FAMILY HISTORY:  Family History  Problem Relation Age of Onset  . Cancer Daughter        breast  .  Rheumatic fever Father   . Diabetes Brother   . Hypertension Brother     CURRENT MEDICATIONS:  Outpatient Encounter Medications as of 11/21/2018  Medication Sig  . ACCU-CHEK GUIDE test strip daily. for testing  . alendronate (FOSAMAX) 70 MG tablet Take 70 mg by mouth once a week. Take with a full glass of water on an empty stomach.  Marland Kitchen anastrozole (ARIMIDEX) 1 MG tablet Take 1 tablet (1 mg total) by mouth daily.  . Calcium Carbonate (CALTRATE 600 PO) Take 1 tablet by mouth.  . Cholecalciferol (VITAMIN D3) 2000 units TABS Take 1 tablet by mouth.  . ezetimibe-simvastatin (VYTORIN) 10-40 MG tablet Take 1 tablet by mouth daily.  . metFORMIN (GLUCOPHAGE) 500 MG tablet Take by mouth daily with breakfast.  . Multiple Vitamins-Minerals (WOMENS MULTI VITAMIN & MINERAL PO) Take 1 tablet by mouth.  Marland Kitchen  omeprazole (PRILOSEC) 20 MG capsule Take 20 mg by mouth daily.  . valsartan-hydrochlorothiazide (DIOVAN-HCT) 320-12.5 MG tablet Take 1 tablet by mouth daily.   No facility-administered encounter medications on file as of 11/21/2018.     ALLERGIES:  No Known Allergies   PHYSICAL EXAM:  ECOG Performance status: 1  Vitals:   11/21/18 0922  BP: (!) 144/74  Pulse: 97  Resp: 16  Temp: (!) 97 F (36.1 C)  SpO2: 98%   Filed Weights   11/21/18 0922  Weight: 151 lb 4.8 oz (68.6 kg)    Physical Exam Constitutional:      Appearance: Normal appearance. She is normal weight.  Cardiovascular:     Rate and Rhythm: Normal rate and regular rhythm.     Heart sounds: Normal heart sounds.  Pulmonary:     Effort: Pulmonary effort is normal.     Breath sounds: Normal breath sounds.  Abdominal:     General: Bowel sounds are normal.     Palpations: Abdomen is soft.  Musculoskeletal: Normal range of motion.  Skin:    General: Skin is warm and dry.  Neurological:     Mental Status: She is alert and oriented to person, place, and time. Mental status is at baseline.  Psychiatric:        Mood and Affect: Mood normal.        Behavior: Behavior normal.        Thought Content: Thought content normal.        Judgment: Judgment normal.      LABORATORY DATA:  I have reviewed the labs as listed.  CBC    Component Value Date/Time   WBC 6.4 11/20/2018 0829   RBC 4.66 11/20/2018 0829   HGB 13.7 11/20/2018 0829   HCT 42.0 11/20/2018 0829   PLT 270 11/20/2018 0829   MCV 90.1 11/20/2018 0829   MCH 29.4 11/20/2018 0829   MCHC 32.6 11/20/2018 0829   RDW 13.7 11/20/2018 0829   LYMPHSABS 1.9 11/20/2018 0829   MONOABS 0.6 11/20/2018 0829   EOSABS 0.2 11/20/2018 0829   BASOSABS 0.1 11/20/2018 0829   CMP Latest Ref Rng & Units 11/20/2018 06/21/2018 03/01/2018  Glucose 70 - 99 mg/dL 113(H) 107(H) 85  BUN 8 - 23 mg/dL _0 Creatinine 0.44 - 1.00 mg/dL 0.70 0.63 0.72  Sodium 135 - 145 mmol/L  139 140 141  Potassium 3.5 - 5.1 mmol/L 4.3 3.9 3.8  Chloride 98 - 111 mmol/L 101 103 103  CO2 22 - 32 mmol/L _1 Calcium 8.9 - 10.3 mg/dL 9.6 9.4 9.4  Total Protein 6.5 -  8.1 g/dL 7.7 8.0 8.1  Total Bilirubin 0.3 - 1.2 mg/dL 0.7 0.7 0.9  Alkaline Phos 38 - 126 U/L 51 43 49  AST 15 - 41 U/L 29 38 36  ALT 0 - 44 U/L 28 34 28   DIAGNOSTIC IMAGING:  I have independently reviewed the mammogram and bone density scans and discussed with the patient.  I personally performed a face-to-face visit.  All questions were answered to patient's stated satisfaction. Encouraged patient to call with any new concerns or questions before his next visit to the cancer center and we can certain see him sooner, if needed.     ASSESSMENT & PLAN:   History of left breast cancer 1. Left breast cancer: - Abnormal mammogram on April 2018. -Biopsy on 09/30/2016 of the left breast at 12 o'clock position.  Consistent with moderately differentiated IDC, ER/PR positive, HER-2 negative. - MRI showed 11 o'clock position left breast, 1.6 x 1.4 x 2.4 cm irregularly bordered enhancing mass. -She underwent left lumpectomy and sentinel lymph node biopsy on 10/28/2016. -Pathology showed moderately differentiated IDC, 0.9 cm associated with prior biopsy site.  An incidental well-differentiated IDC, 0.4 cm located about 5 mm posterior to the first lesion.  Low-grade DCIS was seen.  3 sentinel lymph nodes were negative.  Margins were negative. -She received 16 radiation treatments at [facility] breast center and in [place]. -Anastrozole was started in July 2018. -Today's physical examination did not reveal any changes in either breast.  Left breast lumpectomy site is unchanged.  There is a thickening felt below the scar.  No palpable adenopathy in bilateral axillary. -Labs on 11/20/2018 showed hemoglobin 13.7, WBC 6.4, platelets 270, potassium 4.3, creatinine 0.70 -Her last mammogram was on 07/18/2018 showed B RADS  category 2 benign. -She will have follow-up visits every 4 months for the first 3 years followed by every 6 months for the next 2 years.  2.  Osteopenia: -Bone density test on 11/17/2016 showed T score of -2.4. -She was started on Fosamax 70 mg weekly by her PMD in [place]. She is tolerating it well. -She will continue to take calcium and vitamin D supplements daily -We repeated her DEXA scan on 11/20/2018 which showed a T score of -2.2.        Orders placed this encounter:  Orders Placed This Encounter  Procedures  . Lactate dehydrogenase  . CBC with Differential/Platelet  . Comprehensive metabolic panel  . Vitamin B12  . VITAMIN D 25 Hydroxy (Vit-D Deficiency, Fractures)      Francene Finders, FNP-C [place] [phone]

## 2018-11-21 NOTE — Progress Notes (Signed)
Pt is taking Arimidex as prescribed with no side effects. 

## 2019-02-01 DIAGNOSIS — Z23 Encounter for immunization: Secondary | ICD-10-CM | POA: Diagnosis not present

## 2019-02-28 ENCOUNTER — Encounter (HOSPITAL_COMMUNITY): Payer: Self-pay | Admitting: Nurse Practitioner

## 2019-02-28 ENCOUNTER — Other Ambulatory Visit (HOSPITAL_COMMUNITY): Payer: Self-pay | Admitting: *Deleted

## 2019-02-28 MED ORDER — ALENDRONATE SODIUM 70 MG PO TABS: 70.0000 mg | ORAL_TABLET | ORAL | 2 refills | Status: DC

## 2019-03-26 ENCOUNTER — Inpatient Hospital Stay (HOSPITAL_COMMUNITY): Payer: Medicare Other | Attending: Hematology | Admitting: Hematology

## 2019-03-26 ENCOUNTER — Inpatient Hospital Stay (HOSPITAL_COMMUNITY): Payer: Medicare Other

## 2019-03-26 ENCOUNTER — Other Ambulatory Visit: Payer: Self-pay

## 2019-03-26 VITALS — BP 146/73 | HR 80 | Temp 97.5°F | Resp 18 | Wt 156.2 lb

## 2019-03-26 DIAGNOSIS — M8580 Other specified disorders of bone density and structure, unspecified site: Secondary | ICD-10-CM | POA: Insufficient documentation

## 2019-03-26 DIAGNOSIS — Z79811 Long term (current) use of aromatase inhibitors: Secondary | ICD-10-CM | POA: Diagnosis not present

## 2019-03-26 DIAGNOSIS — Z833 Family history of diabetes mellitus: Secondary | ICD-10-CM | POA: Diagnosis not present

## 2019-03-26 DIAGNOSIS — C50812 Malignant neoplasm of overlapping sites of left female breast: Secondary | ICD-10-CM | POA: Insufficient documentation

## 2019-03-26 DIAGNOSIS — R232 Flushing: Secondary | ICD-10-CM | POA: Insufficient documentation

## 2019-03-26 DIAGNOSIS — Z853 Personal history of malignant neoplasm of breast: Secondary | ICD-10-CM

## 2019-03-26 DIAGNOSIS — Z803 Family history of malignant neoplasm of breast: Secondary | ICD-10-CM | POA: Diagnosis not present

## 2019-03-26 DIAGNOSIS — Z8249 Family history of ischemic heart disease and other diseases of the circulatory system: Secondary | ICD-10-CM | POA: Insufficient documentation

## 2019-03-26 DIAGNOSIS — Z170 Estrogen receptor positive status [ER+]: Secondary | ICD-10-CM | POA: Diagnosis not present

## 2019-03-26 DIAGNOSIS — Z79899 Other long term (current) drug therapy: Secondary | ICD-10-CM | POA: Insufficient documentation

## 2019-03-26 LAB — COMPREHENSIVE METABOLIC PANEL
ALT: 38 U/L (ref 0–44)
AST: 43 U/L — ABNORMAL HIGH (ref 15–41)
Albumin: 4.2 g/dL (ref 3.5–5.0)
Alkaline Phosphatase: 52 U/L (ref 38–126)
Anion gap: 12 (ref 5–15)
BUN: 11 mg/dL (ref 8–23)
CO2: 27 mmol/L (ref 22–32)
Calcium: 9.6 mg/dL (ref 8.9–10.3)
Chloride: 101 mmol/L (ref 98–111)
Creatinine, Ser: 0.7 mg/dL (ref 0.44–1.00)
GFR calc Af Amer: >60 mL/min (ref >60)
GFR calc non Af Amer: >60 mL/min (ref >60)
Glucose, Bld: 115 mg/dL — ABNORMAL HIGH (ref 70–99)
Potassium: 4.2 mmol/L (ref 3.5–5.1)
Sodium: 140 mmol/L (ref 135–145)
Total Bilirubin: 0.9 mg/dL (ref 0.3–1.2)
Total Protein: 7.8 g/dL (ref 6.5–8.1)

## 2019-03-26 LAB — VITAMIN B12: Vitamin B-12: 511 pg/mL (ref 180–914)

## 2019-03-26 LAB — VITAMIN D 25 HYDROXY (VIT D DEFICIENCY, FRACTURES): Vit D, 25-Hydroxy: 44.87 ng/mL (ref 30–100)

## 2019-03-26 LAB — CBC WITH DIFFERENTIAL/PLATELET
Abs Immature Granulocytes: 0.04 10*3/uL (ref 0.00–0.07)
Basophils Absolute: 0.1 10*3/uL (ref 0.0–0.1)
Basophils Relative: 1 %
Eosinophils Absolute: 0.1 10*3/uL (ref 0.0–0.5)
Eosinophils Relative: 2 %
HCT: 42.4 % (ref 36.0–46.0)
Hemoglobin: 13.8 g/dL (ref 12.0–15.0)
Immature Granulocytes: 1 %
Lymphocytes Relative: 26 %
Lymphs Abs: 1.8 10*3/uL (ref 0.7–4.0)
MCH: 29.7 pg (ref 26.0–34.0)
MCHC: 32.5 g/dL (ref 30.0–36.0)
MCV: 91.2 fL (ref 80.0–100.0)
Monocytes Absolute: 0.5 10*3/uL (ref 0.1–1.0)
Monocytes Relative: 7 %
Neutro Abs: 4.5 10*3/uL (ref 1.7–7.7)
Neutrophils Relative %: 63 %
Platelets: 273 10*3/uL (ref 150–400)
RBC: 4.65 MIL/uL (ref 3.87–5.11)
RDW: 14 % (ref 11.5–15.5)
WBC: 7.1 10*3/uL (ref 4.0–10.5)
nRBC: 0 % (ref 0.0–0.2)

## 2019-03-26 LAB — LACTATE DEHYDROGENASE: LDH: 196 U/L — ABNORMAL HIGH (ref 98–192)

## 2019-03-26 NOTE — Assessment & Plan Note (Signed)
1. Left breast cancer: - Abnormal mammogram on April 2018. -Biopsy on 09/30/2016 of the left breast at 12 o'clock position.  Consistent with moderately differentiated IDC, ER/PR positive, HER-2 negative. - MRI showed 11 o'clock position left breast, 1.6 x 1.4 x 2.4 cm irregularly bordered enhancing mass. -She underwent left lumpectomy and sentinel lymph node biopsy on 10/28/2016. -Pathology showed moderately differentiated IDC, 0.9 cm associated with prior biopsy site.  An incidental well-differentiated IDC, 0.4 cm located about 5 mm posterior to the first lesion.  Low-grade DCIS was seen.  3 sentinel lymph nodes were negative.  Margins were negative. -She received 16 radiation treatments at [facility] breast center and in [place]. -Anastrozole was started in July 2018. -Today's physical examination did not reveal any changes in either breast.  Left breast lumpectomy site is unchanged.  There is a thickening felt below the scar.  No palpable adenopathy in bilateral axillary. -Her last mammogram was on 07/18/2018 showed B RADS category 2 benign.  We will schedule her for repeat mammogram in March 2021. -Recommend patient continue on anastrozole daily. She will return to clinic in 6 months or sooner if needed.   2.  Osteopenia: -Bone density test on 11/17/2016 showed T score of -2.4. -She was started on Fosamax 70 mg weekly by her PMD in [place]. She is tolerating it well. -She will continue to take calcium and vitamin D supplements daily -We repeated her DEXA scan on 11/20/2018 which showed a T score of -2.2.

## 2019-03-26 NOTE — Progress Notes (Signed)
[place], [place] [address]   CLINIC:  Medical Oncology/Hematology  PCP:  Curlene Labrum, MD [place] [id number] [phone]   REASON FOR VISIT:  Follow-up for breast cancer  CURRENT THERAPY: Anastrozole    INTERVAL HISTORY:  Laura Oliver 68 y.o. female presents today for follow-up.  She reports overall doing well.  She denies any significant fatigue.  Denies any changes in her breast.  No new lumps, masses, nipple inversion or discharge.  She is currently on anastrozole tolerating well.  She does report tolerable hot flashes.  Denies any change in bowel habits.  Appetite is stable.  No weight loss.  She is here for repeat labs and office visit.   REVIEW OF SYSTEMS:  Review of Systems  Endocrine: Positive for hot flashes.  All other systems reviewed and are negative.    PAST MEDICAL/SURGICAL HISTORY:  Past Medical History:  Diagnosis Date  . Breast cancer ([place]) 10/28/2016   left  . Diabetes mellitus without complication ([place])   . GERD (gastroesophageal reflux disease)   . High cholesterol   . Hypertension    Past Surgical History:  Procedure Laterality Date  . ANKLE ARTHROPLASTY    . BREAST LUMPECTOMY Left 10/28/2016  . TUBAL LIGATION       SOCIAL HISTORY:  Social History   Socioeconomic History  . Marital status: Married    Spouse name: Not on file  . Number of children: Not on file  . Years of education: Not on file  . Highest education level: Not on file  Occupational History  . Occupation: retired  Scientific laboratory technician  . Financial resource strain: Not hard at all  . Food insecurity    Worry: Never true    Inability: Never true  . Transportation needs    Medical: No    Non-medical: No  Tobacco Use  . Smoking status: Never Smoker  . Smokeless tobacco: Never Used  Substance and Sexual Activity  . Alcohol use: Yes    Frequency: Never    Comment: prn beer  . Drug use: Never  . Sexual activity:  Not on file  Lifestyle  . Physical activity    Days per week: Not on file    Minutes per session: Not on file  . Stress: Not at all  Relationships  . Social Herbalist on phone: Not on file    Gets together: Not on file    Attends religious service: Not on file    Active member of club or organization: Not on file    Attends meetings of clubs or organizations: Not on file    Relationship status: Not on file  . Intimate partner violence    Fear of current or ex partner: Not on file    Emotionally abused: Not on file    Physically abused: Not on file    Forced sexual activity: Not on file  Other Topics Concern  . Not on file  Social History Narrative   From [place]   Recently retired and moved to [place] to be closer to her family.     FAMILY HISTORY:  Family History  Problem Relation Age of Onset  . Cancer Daughter        breast  . Rheumatic fever Father   . Diabetes Brother   . Hypertension Brother     CURRENT MEDICATIONS:  Outpatient Encounter Medications as of 03/26/2019  Medication  Sig  . ACCU-CHEK GUIDE test strip daily. for testing  . alendronate (FOSAMAX) 70 MG tablet Take 1 tablet (70 mg total) by mouth once a week. Take with a full glass of water on an empty stomach.  Marland Kitchen anastrozole (ARIMIDEX) 1 MG tablet Take 1 tablet (1 mg total) by mouth daily.  . Calcium Carbonate (CALTRATE 600 PO) Take 1 tablet by mouth.  . Cholecalciferol (VITAMIN D3) 2000 units TABS Take 1 tablet by mouth.  . ezetimibe-simvastatin (VYTORIN) 10-40 MG tablet Take 1 tablet by mouth daily.  . metFORMIN (GLUCOPHAGE) 500 MG tablet Take by mouth daily with breakfast.  . Multiple Vitamins-Minerals (WOMENS MULTI VITAMIN & MINERAL PO) Take 1 tablet by mouth.  Marland Kitchen omeprazole (PRILOSEC) 20 MG capsule Take 20 mg by mouth daily.  . valsartan-hydrochlorothiazide (DIOVAN-HCT) 320-12.5 MG tablet Take 1 tablet by mouth daily.   No facility-administered encounter medications on file as of  03/26/2019.     ALLERGIES:  No Known Allergies   PHYSICAL EXAM:  ECOG Performance status: 1  Vitals:   03/26/19 1029  BP: (!) 146/73  Pulse: 80  Resp: 18  Temp: (!) 97.5 F (36.4 C)  SpO2: 100%   Filed Weights   03/26/19 1029  Weight: 156 lb 3.2 oz (70.9 kg)    Physical Exam Constitutional:      Appearance: Normal appearance.  HENT:     Head: Normocephalic.     Right Ear: External ear normal.     Left Ear: External ear normal.     Nose: Nose normal.     Mouth/Throat:     Pharynx: Oropharynx is clear.  Neck:     Musculoskeletal: Normal range of motion.  Cardiovascular:     Rate and Rhythm: Normal rate and regular rhythm.     Pulses: Normal pulses.     Heart sounds: Normal heart sounds.  Pulmonary:     Effort: Pulmonary effort is normal.     Breath sounds: Normal breath sounds.  Abdominal:     General: Bowel sounds are normal.  Musculoskeletal: Normal range of motion.  Skin:    General: Skin is warm.  Neurological:     General: No focal deficit present.     Mental Status: She is alert and oriented to person, place, and time.  Psychiatric:        Mood and Affect: Mood normal.        Behavior: Behavior normal.        Thought Content: Thought content normal.        Judgment: Judgment normal.      LABORATORY DATA:  I have reviewed the labs as listed.  CBC    Component Value Date/Time   WBC 7.1 03/26/2019 0925   RBC 4.65 03/26/2019 0925   HGB 13.8 03/26/2019 0925   HCT 42.4 03/26/2019 0925   PLT 273 03/26/2019 0925   MCV 91.2 03/26/2019 0925   MCH 29.7 03/26/2019 0925   MCHC 32.5 03/26/2019 0925   RDW 14.0 03/26/2019 0925   LYMPHSABS 1.8 03/26/2019 0925   MONOABS 0.5 03/26/2019 0925   EOSABS 0.1 03/26/2019 0925   BASOSABS 0.1 03/26/2019 0925   CMP Latest Ref Rng & Units 03/26/2019 11/20/2018 06/21/2018  Glucose 70 - 99 mg/dL 115(H) 113(H) 107(H)  BUN 8 - 23 mg/dL '11 18 16  '$ Creatinine 0.44 - 1.00 mg/dL 0.70 0.70 0.63  Sodium 135 - 145 mmol/L  140 139 140  Potassium 3.5 - 5.1 mmol/L 4.2 4.3 3.9  Chloride 98 -  111 mmol/L 101 101 103  CO2 22 - 32 mmol/L '27 28 27  '$ Calcium 8.9 - 10.3 mg/dL 9.6 9.6 9.4  Total Protein 6.5 - 8.1 g/dL 7.8 7.7 8.0  Total Bilirubin 0.3 - 1.2 mg/dL 0.9 0.7 0.7  Alkaline Phos 38 - 126 U/L 52 51 43  AST 15 - 41 U/L 43(H) 29 38  ALT 0 - 44 U/L 38 28 34          ASSESSMENT & PLAN:   History of left breast cancer 1. Left breast cancer: - Abnormal mammogram on April 2018. -Biopsy on 09/30/2016 of the left breast at 12 o'clock position.  Consistent with moderately differentiated IDC, ER/PR positive, HER-2 negative. - MRI showed 11 o'clock position left breast, 1.6 x 1.4 x 2.4 cm irregularly bordered enhancing mass. -She underwent left lumpectomy and sentinel lymph node biopsy on 10/28/2016. -Pathology showed moderately differentiated IDC, 0.9 cm associated with prior biopsy site.  An incidental well-differentiated IDC, 0.4 cm located about 5 mm posterior to the first lesion.  Low-grade DCIS was seen.  3 sentinel lymph nodes were negative.  Margins were negative. -She received 16 radiation treatments at [facility] breast center and in [place]. -Anastrozole was started in July 2018. -Today's physical examination did not reveal any changes in either breast.  Left breast lumpectomy site is unchanged.  There is a thickening felt below the scar.  No palpable adenopathy in bilateral axillary. -Her last mammogram was on 07/18/2018 showed B RADS category 2 benign.  We will schedule her for repeat mammogram in March 2021. -Recommend patient continue on anastrozole daily. She will return to clinic in 6 months or sooner if needed.   2.  Osteopenia: -Bone density test on 11/17/2016 showed T score of -2.4. -She was started on Fosamax 70 mg weekly by her PMD in [place]. She is tolerating it well. -She will continue to take calcium and vitamin D supplements daily -We repeated her DEXA scan on 11/20/2018 which  showed a T score of -2.2.        Orders placed this encounter:  Orders Placed This Encounter  Procedures  . [facility] [phone]

## 2019-05-10 DIAGNOSIS — E1169 Type 2 diabetes mellitus with other specified complication: Secondary | ICD-10-CM | POA: Diagnosis not present

## 2019-05-10 DIAGNOSIS — Z800 Family history of malignant neoplasm of digestive organs: Secondary | ICD-10-CM | POA: Diagnosis not present

## 2019-05-10 DIAGNOSIS — B351 Tinea unguium: Secondary | ICD-10-CM | POA: Diagnosis not present

## 2019-05-10 DIAGNOSIS — K219 Gastro-esophageal reflux disease without esophagitis: Secondary | ICD-10-CM | POA: Diagnosis not present

## 2019-05-10 DIAGNOSIS — E782 Mixed hyperlipidemia: Secondary | ICD-10-CM | POA: Diagnosis not present

## 2019-05-10 DIAGNOSIS — I10 Essential (primary) hypertension: Secondary | ICD-10-CM | POA: Diagnosis not present

## 2019-05-10 DIAGNOSIS — Z6829 Body mass index (BMI) 29.0-29.9, adult: Secondary | ICD-10-CM | POA: Diagnosis not present

## 2019-05-10 DIAGNOSIS — C50911 Malignant neoplasm of unspecified site of right female breast: Secondary | ICD-10-CM | POA: Diagnosis not present

## 2019-05-20 ENCOUNTER — Other Ambulatory Visit (HOSPITAL_COMMUNITY): Payer: Self-pay | Admitting: Hematology

## 2019-06-28 DIAGNOSIS — Z23 Encounter for immunization: Secondary | ICD-10-CM | POA: Diagnosis not present

## 2019-07-27 DIAGNOSIS — Z23 Encounter for immunization: Secondary | ICD-10-CM | POA: Diagnosis not present

## 2019-09-10 ENCOUNTER — Other Ambulatory Visit (HOSPITAL_COMMUNITY): Payer: Self-pay | Admitting: Hematology

## 2019-09-25 ENCOUNTER — Other Ambulatory Visit (HOSPITAL_COMMUNITY): Payer: Self-pay | Admitting: Nurse Practitioner

## 2019-09-25 DIAGNOSIS — Z853 Personal history of malignant neoplasm of breast: Secondary | ICD-10-CM

## 2019-10-03 ENCOUNTER — Other Ambulatory Visit (HOSPITAL_COMMUNITY): Payer: Self-pay | Admitting: Nurse Practitioner

## 2019-10-03 DIAGNOSIS — Z9889 Other specified postprocedural states: Secondary | ICD-10-CM

## 2019-10-11 ENCOUNTER — Other Ambulatory Visit (HOSPITAL_COMMUNITY): Payer: Self-pay | Admitting: Hematology

## 2019-10-23 ENCOUNTER — Encounter (HOSPITAL_COMMUNITY): Payer: Medicare Other

## 2019-10-23 ENCOUNTER — Ambulatory Visit (HOSPITAL_COMMUNITY)
Admission: RE | Admit: 2019-10-23 | Discharge: 2019-10-23 | Disposition: A | Discharge status: Home / Self Care | Payer: Medicare Other | Source: Ambulatory Visit | Attending: Nurse Practitioner | Admitting: Nurse Practitioner

## 2019-10-23 ENCOUNTER — Other Ambulatory Visit: Payer: Self-pay

## 2019-10-23 ENCOUNTER — Ambulatory Visit (HOSPITAL_COMMUNITY)
Admission: RE | Admit: 2019-10-23 | Discharge: 2019-10-23 | Disposition: A | Discharge status: Home / Self Care | Payer: Medicare Other | Source: Ambulatory Visit | Attending: Hematology | Admitting: Hematology

## 2019-10-23 ENCOUNTER — Encounter (HOSPITAL_COMMUNITY): Payer: Self-pay

## 2019-10-23 DIAGNOSIS — Z853 Personal history of malignant neoplasm of breast: Secondary | ICD-10-CM | POA: Insufficient documentation

## 2019-10-23 DIAGNOSIS — Z9889 Other specified postprocedural states: Secondary | ICD-10-CM | POA: Diagnosis not present

## 2019-10-25 ENCOUNTER — Encounter (HOSPITAL_COMMUNITY): Payer: Self-pay | Admitting: Nurse Practitioner

## 2019-10-25 ENCOUNTER — Inpatient Hospital Stay (HOSPITAL_COMMUNITY): Payer: Medicare Other | Attending: Oncology | Admitting: Nurse Practitioner

## 2019-10-25 ENCOUNTER — Other Ambulatory Visit: Payer: Self-pay

## 2019-10-25 DIAGNOSIS — C50212 Malignant neoplasm of upper-inner quadrant of left female breast: Secondary | ICD-10-CM | POA: Diagnosis not present

## 2019-10-25 DIAGNOSIS — M8580 Other specified disorders of bone density and structure, unspecified site: Secondary | ICD-10-CM | POA: Insufficient documentation

## 2019-10-25 DIAGNOSIS — Z853 Personal history of malignant neoplasm of breast: Secondary | ICD-10-CM | POA: Diagnosis not present

## 2019-10-25 DIAGNOSIS — Z803 Family history of malignant neoplasm of breast: Secondary | ICD-10-CM | POA: Diagnosis not present

## 2019-10-25 DIAGNOSIS — Z170 Estrogen receptor positive status [ER+]: Secondary | ICD-10-CM | POA: Insufficient documentation

## 2019-10-25 DIAGNOSIS — Z8249 Family history of ischemic heart disease and other diseases of the circulatory system: Secondary | ICD-10-CM | POA: Insufficient documentation

## 2019-10-25 DIAGNOSIS — Z79899 Other long term (current) drug therapy: Secondary | ICD-10-CM | POA: Insufficient documentation

## 2019-10-25 DIAGNOSIS — Z833 Family history of diabetes mellitus: Secondary | ICD-10-CM | POA: Diagnosis not present

## 2019-10-25 NOTE — Patient Instructions (Signed)
[facility] at [facility] Discharge Instructions  Follow up in 6 months with labs    Thank you for choosing [facility] at [facility] to provide your oncology and hematology care.  To afford each patient quality time with our provider, please arrive at least 15 minutes before your scheduled appointment time.   If you have a lab appointment with the Cancer Center please come in thru the Main Entrance and check in at the main information desk.  You need to re-schedule your appointment should you arrive 10 or more minutes late.  We strive to give you quality time with our providers, and arriving late affects you and other patients whose appointments are after yours.  Also, if you no show three or more times for appointments you may be dismissed from the clinic at the providers discretion.     Again, thank you for choosing [facility].  Our hope is that these requests will decrease the amount of time that you wait before being seen by our physicians.       _____________________________________________________________  Should you have questions after your visit to [facility], please contact our office at [phone] between the hours of 8:00 a.m. and 4:30 p.m.  Voicemails left after 4:00 p.m. will not be returned until the following business day.  For prescription refill requests, have your pharmacy contact our office and allow 72 hours.    Due to Covid, you will need to wear a mask upon entering the hospital. If you do not have a mask, a mask will be given to you at the Main Entrance upon arrival. For doctor visits, patients may have 1 support person with them. For treatment visits, patients can not have anyone with them due to social distancing guidelines and our immunocompromised population.      

## 2019-10-25 NOTE — Assessment & Plan Note (Signed)
1. Left breast cancer: - Abnormal mammogram on April 2018. -Biopsy on 09/30/2016 of the left breast at 12 o'clock position.  Consistent with moderately differentiated IDC, ER/PR positive, HER-2 negative. - MRI showed 11 o'clock position left breast, 1.6 x 1.4 x 2.4 cm irregularly bordered enhancing mass. -She underwent left lumpectomy and sentinel lymph node biopsy on 10/28/2016. -Pathology showed moderately differentiated IDC, 0.9 cm associated with prior biopsy site.  An incidental well-differentiated IDC, 0.4 cm located about 5 mm posterior to the first lesion.  Low-grade DCIS was seen.  3 sentinel lymph nodes were negative.  Margins were negative. -She received 16 radiation treatments at [facility] breast center and in [place]. -Anastrozole was started in July 2018. -Today's physical examination did not reveal any changes in either breast.  Left breast lumpectomy site is unchanged.  There is a thickening felt below the scar.  No palpable adenopathy in bilateral axillary. -Her last mammogram was on 10/23/2019 showed B RADS category 2 benign. -She did not want to have labs at this visit. -We will see her back every 6 months for next 2 years.  2.  Osteopenia: -Bone density test on 11/17/2016 showed T score of -2.4. -She was started on Fosamax 70 mg weekly by her PMD in [place]. She is tolerating it well. -She will continue to take calcium and vitamin D supplements daily -We repeated her DEXA scan on 11/20/2018 which showed a T score of -2.2.

## 2019-10-25 NOTE — Progress Notes (Signed)
Laura Oliver, [place] [id number]   CLINIC:  Medical Oncology/Hematology  PCP:  Curlene Labrum, MD [place] [address] [phone]   REASON FOR VISIT: Follow-up for breast cancer   CURRENT THERAPY: Anastrozole   INTERVAL HISTORY:  Laura Oliver 69 y.o. female returns for routine follow-up for breast cancer.  Patient reports she is taking her anastrozole as prescribed.  She has no unwanted side effects.  She denies any new lumps or bumps present she denies any new bone pain. Denies any nausea, vomiting, or diarrhea. Denies any new pains. Had not noticed any recent bleeding such as epistaxis, hematuria or hematochezia. Denies recent chest pain on exertion, shortness of breath on minimal exertion, pre-syncopal episodes, or palpitations. Denies any numbness or tingling in hands or feet. Denies any recent fevers, infections, or recent hospitalizations. Patient reports appetite at 100% and energy level at 75%.  She is eating well maintain her weight this time.     REVIEW OF SYSTEMS:  Review of Systems  All other systems reviewed and are negative.    PAST MEDICAL/SURGICAL HISTORY:  Past Medical History:  Diagnosis Date   Breast cancer ([place]) 10/28/2016   left   Diabetes mellitus without complication (HCC)    GERD (gastroesophageal reflux disease)    High cholesterol    Hypertension    Past Surgical History:  Procedure Laterality Date   ANKLE ARTHROPLASTY     BREAST LUMPECTOMY Left 10/28/2016   TUBAL LIGATION       SOCIAL HISTORY:  Social History   Socioeconomic History   Marital status: Married    Spouse name: Not on file   Number of children: Not on file   Years of education: Not on file   Highest education level: Not on file  Occupational History   Occupation: retired  Tobacco Use   Smoking status: Never Smoker   Smokeless tobacco: Never Used  Scientific laboratory technician Use: Never used  Substance and  Sexual Activity   Alcohol use: Yes    Comment: prn beer   Drug use: Never   Sexual activity: Not on file  Other Topics Concern   Not on file  Social History Narrative   From [place]   Recently retired and moved to [place] to be closer to her family.    Social Determinants of Health   Financial Resource Strain:    Difficulty of Paying Living Expenses:   Food Insecurity:    Worried About Charity fundraiser in the Last Year:    Arboriculturist in the Last Year:   Transportation Needs:    Film/video editor (Medical):    Lack of Transportation (Non-Medical):   Physical Activity:    Days of Exercise per Week:    Minutes of Exercise per Session:   Stress:    Feeling of Stress :   Social Connections:    Frequency of Communication with Friends and Family:    Frequency of Social Gatherings with Friends and Family:    Attends Religious Services:    Active Member of Clubs or Organizations:    Attends Music therapist:    Marital Status:   Intimate Partner Violence:    Fear of Current or Ex-Partner:    Emotionally Abused:    Physically Abused:    Sexually Abused:     FAMILY HISTORY:  Family History  Problem Relation Age of Onset  Cancer Daughter        breast   Rheumatic fever Father    Diabetes Brother    Hypertension Brother     CURRENT MEDICATIONS:  Outpatient Encounter Medications as of 10/25/2019  Medication Sig   ACCU-CHEK GUIDE test strip daily. for testing   alendronate (FOSAMAX) 70 MG tablet TAKE 1 TABLET BY MOUTH ONCE A WEEK. TAKE WITH A FULL GLASS OF WATER ON AN EMPTY STOMACH   anastrozole (ARIMIDEX) 1 MG tablet TAKE 1 TABLET(1 MG) BY MOUTH DAILY   Calcium Carbonate (CALTRATE 600 PO) Take 1 tablet by mouth.   Cholecalciferol (VITAMIN D3) 2000 units TABS Take 1 tablet by mouth.   ezetimibe-simvastatin (VYTORIN) 10-40 MG tablet Take 1 tablet by mouth daily.   metFORMIN (GLUCOPHAGE) 500 MG tablet Take by mouth  daily with breakfast.   Multiple Vitamins-Minerals (WOMENS MULTI VITAMIN & MINERAL PO) Take 1 tablet by mouth.   omeprazole (PRILOSEC) 20 MG capsule Take 20 mg by mouth daily.   valsartan-hydrochlorothiazide (DIOVAN-HCT) 320-12.5 MG tablet Take 1 tablet by mouth daily.   No facility-administered encounter medications on file as of 10/25/2019.    ALLERGIES:  No Known Allergies   PHYSICAL EXAM:  ECOG Performance status: 1  Vitals:   10/25/19 1030  BP: 135/82  Pulse: 83  Resp: 18  Temp: 99.1 F (37.3 C)  SpO2: 98%   Filed Weights   10/25/19 1030  Weight: 156 lb 9.6 oz (71 kg)   Physical Exam Constitutional:      Appearance: Normal appearance. She is normal weight.  Cardiovascular:     Rate and Rhythm: Normal rate and regular rhythm.     Heart sounds: Normal heart sounds.  Pulmonary:     Effort: Pulmonary effort is normal.     Breath sounds: Normal breath sounds.  Abdominal:     General: Bowel sounds are normal.     Palpations: Abdomen is soft.  Musculoskeletal:        General: Normal range of motion.  Skin:    General: Skin is warm.  Neurological:     Mental Status: She is alert and oriented to person, place, and time. Mental status is at baseline.  Psychiatric:        Mood and Affect: Mood normal.        Behavior: Behavior normal.        Thought Content: Thought content normal.        Judgment: Judgment normal.      LABORATORY DATA:  I have reviewed the labs as listed.  CBC    Component Value Date/Time   WBC 7.1 03/26/2019 0925   RBC 4.65 03/26/2019 0925   HGB 13.8 03/26/2019 0925   HCT 42.4 03/26/2019 0925   PLT 273 03/26/2019 0925   MCV 91.2 03/26/2019 0925   MCH 29.7 03/26/2019 0925   MCHC 32.5 03/26/2019 0925   RDW 14.0 03/26/2019 0925   LYMPHSABS 1.8 03/26/2019 0925   MONOABS 0.5 03/26/2019 0925   EOSABS 0.1 03/26/2019 0925   BASOSABS 0.1 03/26/2019 0925   CMP Latest Ref Rng & Units 03/26/2019 11/20/2018 06/21/2018  Glucose 70 - 99 mg/dL  115(H) 113(H) 107(H)  BUN 8 - 23 mg/dL _0 Creatinine 0.44 - 1.00 mg/dL 0.70 0.70 0.63  Sodium 135 - 145 mmol/L 140 139 140  Potassium 3.5 - 5.1 mmol/L 4.2 4.3 3.9  Chloride 98 - 111 mmol/L 101 101 103  CO2 22 - 32 mmol/L _1 Calcium  8.9 - 10.3 mg/dL 9.6 9.6 9.4  Total Protein 6.5 - 8.1 g/dL 7.8 7.7 8.0  Total Bilirubin 0.3 - 1.2 mg/dL 0.9 0.7 0.7  Alkaline Phos 38 - 126 U/L 52 51 43  AST 15 - 41 U/L 43(H) 29 38  ALT 0 - 44 U/L 38 28 34    All questions were answered to patient's stated satisfaction. Encouraged patient to call with any new concerns or questions before his next visit to the cancer center and we can certain see him sooner, if needed.     ASSESSMENT & PLAN:  History of left breast cancer 1. Left breast cancer: - Abnormal mammogram on April 2018. -Biopsy on 09/30/2016 of the left breast at 12 o'clock position.  Consistent with moderately differentiated IDC, ER/PR positive, HER-2 negative. - MRI showed 11 o'clock position left breast, 1.6 x 1.4 x 2.4 cm irregularly bordered enhancing mass. -She underwent left lumpectomy and sentinel lymph node biopsy on 10/28/2016. -Pathology showed moderately differentiated IDC, 0.9 cm associated with prior biopsy site.  An incidental well-differentiated IDC, 0.4 cm located about 5 mm posterior to the first lesion.  Low-grade DCIS was seen.  3 sentinel lymph nodes were negative.  Margins were negative. -She received 16 radiation treatments at [facility] breast center and in [place]. -Anastrozole was started in July 2018. -Today's physical examination did not reveal any changes in either breast.  Left breast lumpectomy site is unchanged.  There is a thickening felt below the scar.  No palpable adenopathy in bilateral axillary. -Her last mammogram was on 10/23/2019 showed B RADS category 2 benign. -She did not want to have labs at this visit. -We will see her back every 6 months for next 2 years.  2.  Osteopenia: -Bone  density test on 11/17/2016 showed T score of -2.4. -She was started on Fosamax 70 mg weekly by her PMD in [place]. She is tolerating it well. -She will continue to take calcium and vitamin D supplements daily -We repeated her DEXA scan on 11/20/2018 which showed a T score of -2.2.       Orders placed this encounter:  Orders Placed This Encounter  Procedures   Lactate dehydrogenase   CBC with Differential/Platelet   Comprehensive metabolic panel   VITAMIN D 25 Hydroxy (Vit-D Deficiency, Fractures)   Vitamin B12      Laura Finders, FNP-C [place] [phone]

## 2019-11-14 DIAGNOSIS — E782 Mixed hyperlipidemia: Secondary | ICD-10-CM | POA: Diagnosis not present

## 2019-11-14 DIAGNOSIS — C50911 Malignant neoplasm of unspecified site of right female breast: Secondary | ICD-10-CM | POA: Diagnosis not present

## 2019-11-14 DIAGNOSIS — M8580 Other specified disorders of bone density and structure, unspecified site: Secondary | ICD-10-CM | POA: Diagnosis not present

## 2019-11-14 DIAGNOSIS — K219 Gastro-esophageal reflux disease without esophagitis: Secondary | ICD-10-CM | POA: Diagnosis not present

## 2019-11-14 DIAGNOSIS — I10 Essential (primary) hypertension: Secondary | ICD-10-CM | POA: Diagnosis not present

## 2019-11-14 DIAGNOSIS — Z6829 Body mass index (BMI) 29.0-29.9, adult: Secondary | ICD-10-CM | POA: Diagnosis not present

## 2019-11-14 DIAGNOSIS — E1169 Type 2 diabetes mellitus with other specified complication: Secondary | ICD-10-CM | POA: Diagnosis not present

## 2019-12-12 DIAGNOSIS — I10 Essential (primary) hypertension: Secondary | ICD-10-CM | POA: Diagnosis not present

## 2019-12-23 ENCOUNTER — Other Ambulatory Visit (HOSPITAL_COMMUNITY): Payer: Self-pay | Admitting: Hematology

## 2019-12-27 DIAGNOSIS — I10 Essential (primary) hypertension: Secondary | ICD-10-CM | POA: Diagnosis not present

## 2019-12-27 DIAGNOSIS — E1169 Type 2 diabetes mellitus with other specified complication: Secondary | ICD-10-CM | POA: Diagnosis not present

## 2019-12-27 DIAGNOSIS — Z6829 Body mass index (BMI) 29.0-29.9, adult: Secondary | ICD-10-CM | POA: Diagnosis not present

## 2019-12-27 DIAGNOSIS — Z1389 Encounter for screening for other disorder: Secondary | ICD-10-CM | POA: Diagnosis not present

## 2019-12-27 DIAGNOSIS — I459 Conduction disorder, unspecified: Secondary | ICD-10-CM | POA: Diagnosis not present

## 2019-12-27 DIAGNOSIS — R011 Cardiac murmur, unspecified: Secondary | ICD-10-CM | POA: Diagnosis not present

## 2019-12-27 DIAGNOSIS — Z1331 Encounter for screening for depression: Secondary | ICD-10-CM | POA: Diagnosis not present

## 2019-12-31 DIAGNOSIS — I361 Nonrheumatic tricuspid (valve) insufficiency: Secondary | ICD-10-CM | POA: Diagnosis not present

## 2019-12-31 DIAGNOSIS — I459 Conduction disorder, unspecified: Secondary | ICD-10-CM | POA: Diagnosis not present

## 2019-12-31 DIAGNOSIS — I088 Other rheumatic multiple valve diseases: Secondary | ICD-10-CM | POA: Diagnosis not present

## 2019-12-31 DIAGNOSIS — R011 Cardiac murmur, unspecified: Secondary | ICD-10-CM | POA: Diagnosis not present

## 2019-12-31 DIAGNOSIS — I517 Cardiomegaly: Secondary | ICD-10-CM | POA: Diagnosis not present

## 2019-12-31 DIAGNOSIS — I371 Nonrheumatic pulmonary valve insufficiency: Secondary | ICD-10-CM | POA: Diagnosis not present

## 2020-02-08 DIAGNOSIS — E1169 Type 2 diabetes mellitus with other specified complication: Secondary | ICD-10-CM | POA: Diagnosis not present

## 2020-02-08 DIAGNOSIS — G4700 Insomnia, unspecified: Secondary | ICD-10-CM | POA: Diagnosis not present

## 2020-02-08 DIAGNOSIS — Z6829 Body mass index (BMI) 29.0-29.9, adult: Secondary | ICD-10-CM | POA: Diagnosis not present

## 2020-02-08 DIAGNOSIS — Z0001 Encounter for general adult medical examination with abnormal findings: Secondary | ICD-10-CM | POA: Diagnosis not present

## 2020-02-08 DIAGNOSIS — Z23 Encounter for immunization: Secondary | ICD-10-CM | POA: Diagnosis not present

## 2020-02-08 DIAGNOSIS — I5030 Unspecified diastolic (congestive) heart failure: Secondary | ICD-10-CM | POA: Diagnosis not present

## 2020-02-08 DIAGNOSIS — I10 Essential (primary) hypertension: Secondary | ICD-10-CM | POA: Diagnosis not present

## 2020-02-19 IMAGING — MG DIGITAL DIAGNOSTIC BILATERAL MAMMOGRAM WITH TOMO AND CAD
6 of 9 series · 6 of 25 positions shown · non-contrast
Comparison: Previous exam(s).

CLINICAL DATA: 67-year-old female left lumpectomy in 6181. No
current symptoms.

EXAM:
DIGITAL DIAGNOSTIC BILATERAL MAMMOGRAM WITH CAD AND TOMO

[L CC]
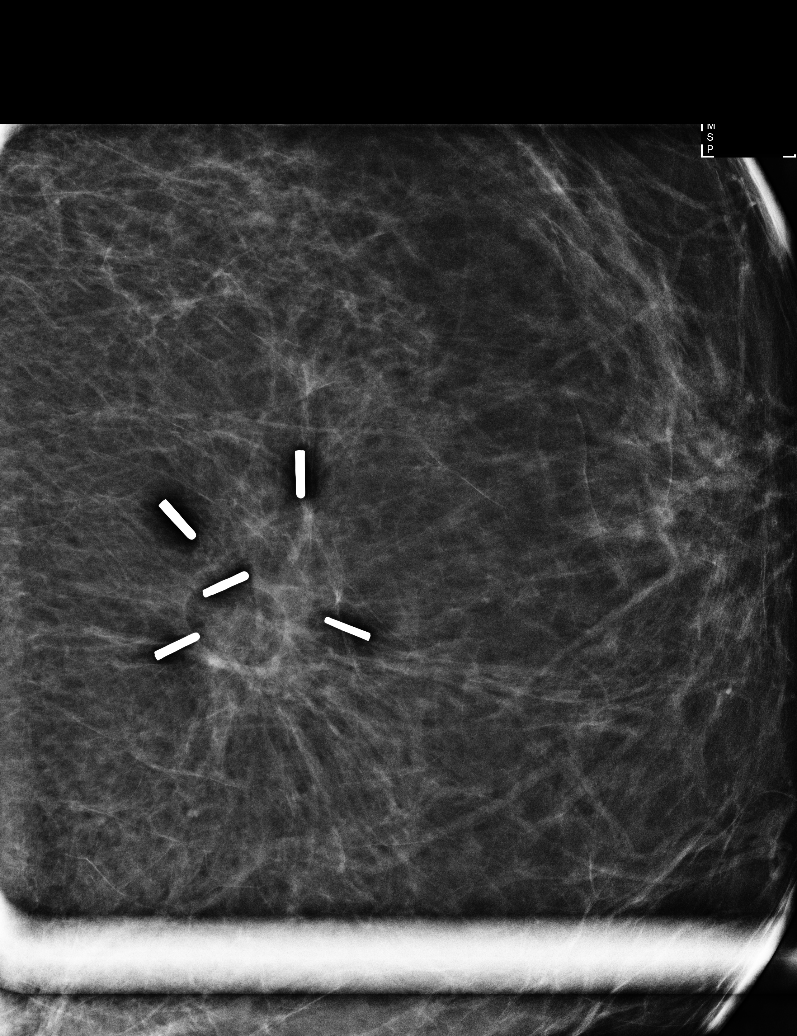

[L CC synth-2D]
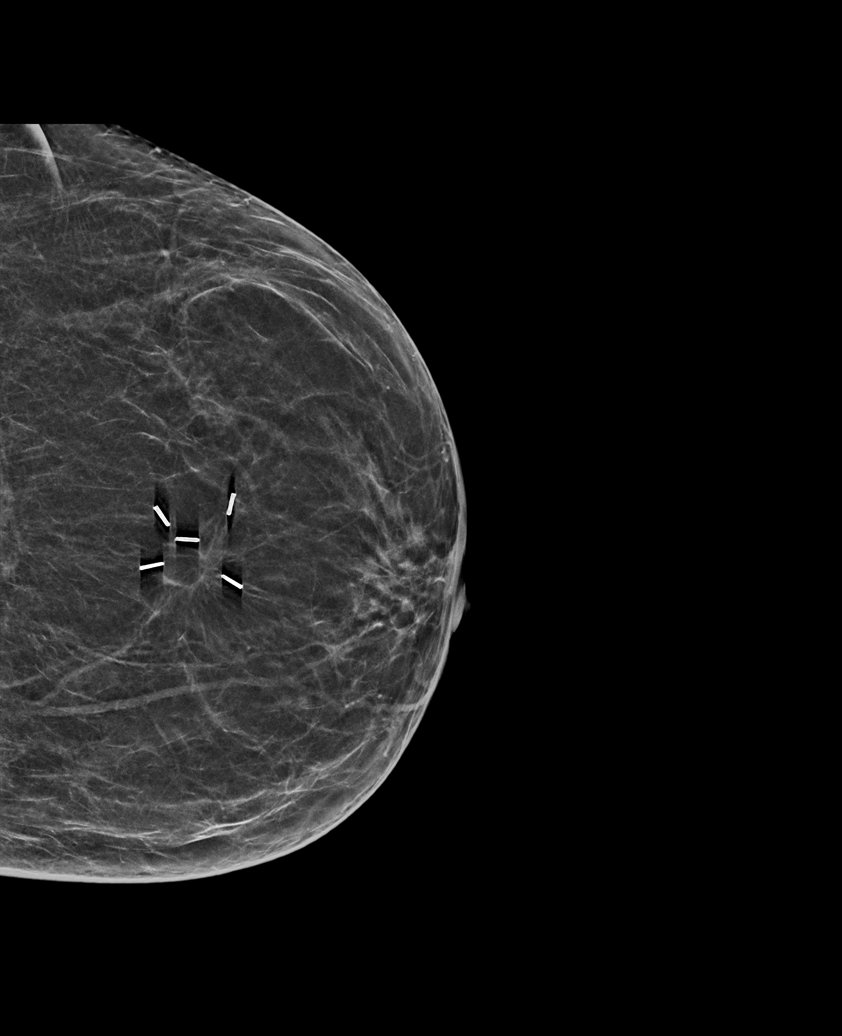

[R MLO synth-2D]
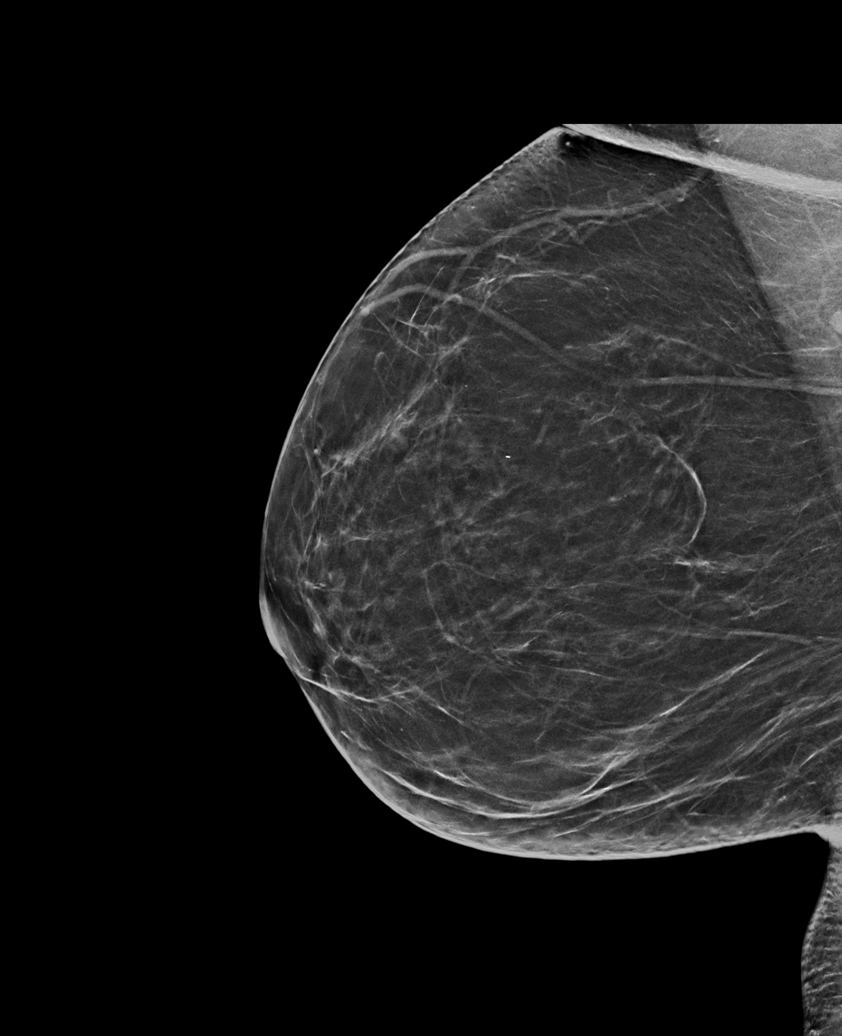

[R CC synth-2D]
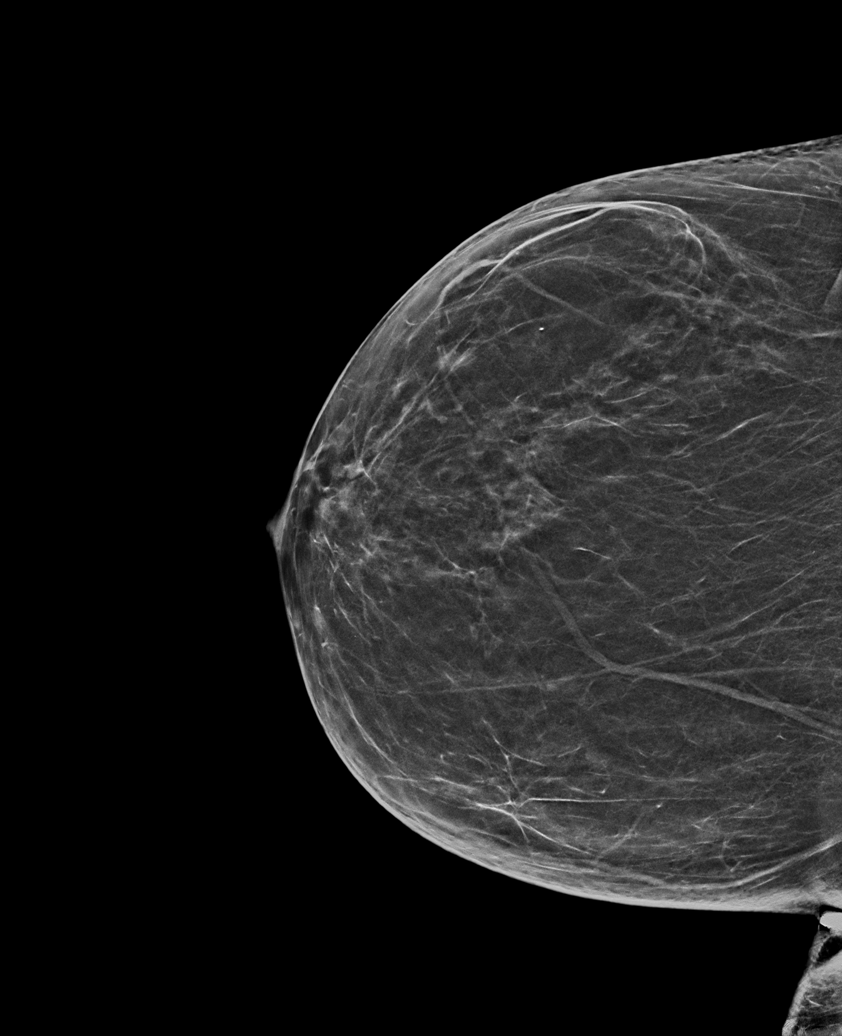

[L MLO synth-2D]
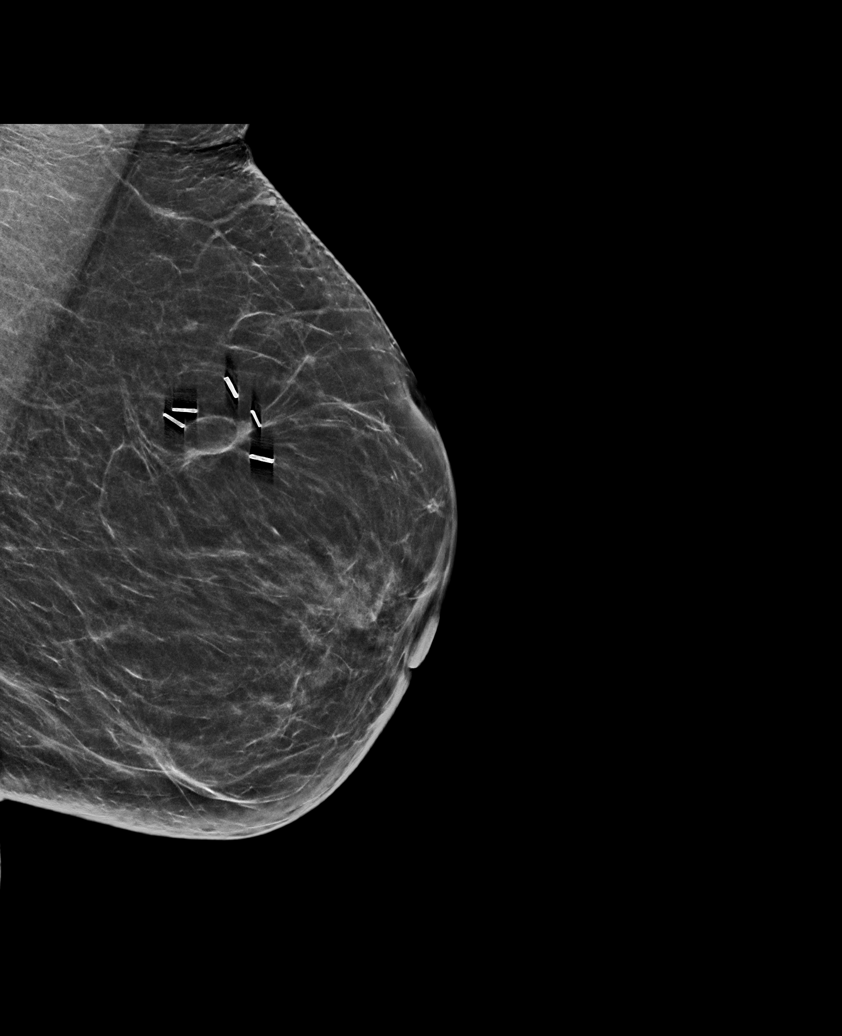

[L MLO tomo · tomo slice 31/61.0]
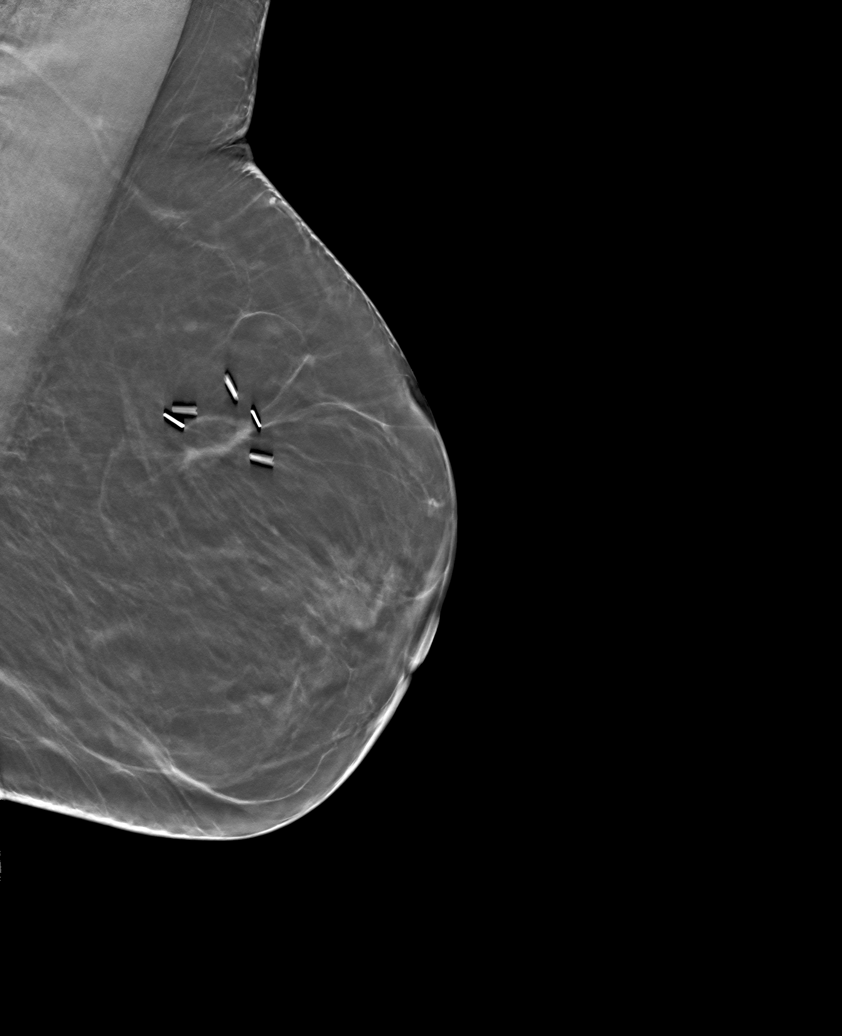

[6 of 25 positions shown; findings below may reference images not displayed]

ACR Breast Density Category b: There are scattered areas of
fibroglandular density.
FINDINGS: Postsurgical changes again demonstrated superior central. No new or
suspicious findings are breast. The parenchymal pattern is stable.

Mammographic images were processed with CAD.
IMPRESSION: 1. No mammographic evidence malignancy in either breast.
2. Stable left breast postsurgical changes.

RECOMMENDATION:
Diagnostic mammogram is suggested in 1 year. (Code:SV-W-P61)

I have discussed the findings and recommendations with the patient.
Results were also provided in writing at the conclusion of the
visit. If applicable, a reminder letter will be sent to the patient
regarding the next appointment.

BI-RADS CATEGORY  2: Benign.

## 2020-03-01 DIAGNOSIS — Z23 Encounter for immunization: Secondary | ICD-10-CM | POA: Diagnosis not present

## 2020-04-06 ENCOUNTER — Other Ambulatory Visit (HOSPITAL_COMMUNITY): Payer: Self-pay | Admitting: Hematology

## 2020-05-05 ENCOUNTER — Other Ambulatory Visit (HOSPITAL_COMMUNITY): Payer: Self-pay | Admitting: Surgery

## 2020-05-05 DIAGNOSIS — Z853 Personal history of malignant neoplasm of breast: Secondary | ICD-10-CM

## 2020-05-05 DIAGNOSIS — M8580 Other specified disorders of bone density and structure, unspecified site: Secondary | ICD-10-CM

## 2020-05-06 ENCOUNTER — Other Ambulatory Visit: Payer: Self-pay

## 2020-05-06 ENCOUNTER — Inpatient Hospital Stay (HOSPITAL_COMMUNITY): Payer: Medicare Other | Attending: Hematology

## 2020-05-06 DIAGNOSIS — Z803 Family history of malignant neoplasm of breast: Secondary | ICD-10-CM | POA: Diagnosis not present

## 2020-05-06 DIAGNOSIS — Z833 Family history of diabetes mellitus: Secondary | ICD-10-CM | POA: Insufficient documentation

## 2020-05-06 DIAGNOSIS — Z79899 Other long term (current) drug therapy: Secondary | ICD-10-CM | POA: Diagnosis not present

## 2020-05-06 DIAGNOSIS — Z853 Personal history of malignant neoplasm of breast: Secondary | ICD-10-CM

## 2020-05-06 DIAGNOSIS — C50212 Malignant neoplasm of upper-inner quadrant of left female breast: Secondary | ICD-10-CM | POA: Diagnosis not present

## 2020-05-06 DIAGNOSIS — Z170 Estrogen receptor positive status [ER+]: Secondary | ICD-10-CM | POA: Diagnosis not present

## 2020-05-06 DIAGNOSIS — Z8249 Family history of ischemic heart disease and other diseases of the circulatory system: Secondary | ICD-10-CM | POA: Diagnosis not present

## 2020-05-06 DIAGNOSIS — M8580 Other specified disorders of bone density and structure, unspecified site: Secondary | ICD-10-CM | POA: Diagnosis not present

## 2020-05-06 DIAGNOSIS — Z8489 Family history of other specified conditions: Secondary | ICD-10-CM | POA: Insufficient documentation

## 2020-05-06 LAB — COMPREHENSIVE METABOLIC PANEL
ALT: 39 U/L (ref 0–44)
AST: 38 U/L (ref 15–41)
Albumin: 4.4 g/dL (ref 3.5–5.0)
Alkaline Phosphatase: 56 U/L (ref 38–126)
Anion gap: 12 (ref 5–15)
BUN: 16 mg/dL (ref 8–23)
CO2: 26 mmol/L (ref 22–32)
Calcium: 9.6 mg/dL (ref 8.9–10.3)
Chloride: 99 mmol/L (ref 98–111)
Creatinine, Ser: 0.63 mg/dL (ref 0.44–1.00)
GFR, Estimated: >60 mL/min (ref >60)
Glucose, Bld: 114 mg/dL — ABNORMAL HIGH (ref 70–99)
Potassium: 3.6 mmol/L (ref 3.5–5.1)
Sodium: 137 mmol/L (ref 135–145)
Total Bilirubin: 0.5 mg/dL (ref 0.3–1.2)
Total Protein: 8.2 g/dL — ABNORMAL HIGH (ref 6.5–8.1)

## 2020-05-06 LAB — CBC WITH DIFFERENTIAL/PLATELET
Abs Immature Granulocytes: 0.04 10*3/uL (ref 0.00–0.07)
Basophils Absolute: 0.1 10*3/uL (ref 0.0–0.1)
Basophils Relative: 1 %
Eosinophils Absolute: 0.1 10*3/uL (ref 0.0–0.5)
Eosinophils Relative: 2 %
HCT: 39 % (ref 36.0–46.0)
Hemoglobin: 12.7 g/dL (ref 12.0–15.0)
Immature Granulocytes: 1 %
Lymphocytes Relative: 22 %
Lymphs Abs: 1.8 10*3/uL (ref 0.7–4.0)
MCH: 29.1 pg (ref 26.0–34.0)
MCHC: 32.6 g/dL (ref 30.0–36.0)
MCV: 89.2 fL (ref 80.0–100.0)
Monocytes Absolute: 0.6 10*3/uL (ref 0.1–1.0)
Monocytes Relative: 7 %
Neutro Abs: 5.6 10*3/uL (ref 1.7–7.7)
Neutrophils Relative %: 67 %
Platelets: 282 10*3/uL (ref 150–400)
RBC: 4.37 MIL/uL (ref 3.87–5.11)
RDW: 14.5 % (ref 11.5–15.5)
WBC: 8.1 10*3/uL (ref 4.0–10.5)
nRBC: 0 % (ref 0.0–0.2)

## 2020-05-06 LAB — LACTATE DEHYDROGENASE: LDH: 175 U/L (ref 98–192)

## 2020-05-06 LAB — VITAMIN B12: Vitamin B-12: 492 pg/mL (ref 180–914)

## 2020-05-06 LAB — VITAMIN D 25 HYDROXY (VIT D DEFICIENCY, FRACTURES): Vit D, 25-Hydroxy: 60.32 ng/mL (ref 30–100)

## 2020-05-13 ENCOUNTER — Inpatient Hospital Stay (HOSPITAL_BASED_OUTPATIENT_CLINIC_OR_DEPARTMENT_OTHER): Payer: Medicare Other | Admitting: Oncology

## 2020-05-13 ENCOUNTER — Other Ambulatory Visit: Payer: Self-pay

## 2020-05-13 VITALS — BP 132/75 | HR 84 | Temp 97.0°F | Resp 18 | Wt 162.9 lb

## 2020-05-13 DIAGNOSIS — Z853 Personal history of malignant neoplasm of breast: Secondary | ICD-10-CM | POA: Diagnosis not present

## 2020-05-13 DIAGNOSIS — Z170 Estrogen receptor positive status [ER+]: Secondary | ICD-10-CM | POA: Diagnosis not present

## 2020-05-13 DIAGNOSIS — M8580 Other specified disorders of bone density and structure, unspecified site: Secondary | ICD-10-CM | POA: Diagnosis not present

## 2020-05-13 DIAGNOSIS — Z79899 Other long term (current) drug therapy: Secondary | ICD-10-CM | POA: Diagnosis not present

## 2020-05-13 DIAGNOSIS — Z8249 Family history of ischemic heart disease and other diseases of the circulatory system: Secondary | ICD-10-CM | POA: Diagnosis not present

## 2020-05-13 DIAGNOSIS — Z833 Family history of diabetes mellitus: Secondary | ICD-10-CM | POA: Diagnosis not present

## 2020-05-13 DIAGNOSIS — C50212 Malignant neoplasm of upper-inner quadrant of left female breast: Secondary | ICD-10-CM | POA: Diagnosis not present

## 2020-05-13 NOTE — Progress Notes (Signed)
[place], [place] [address]   CLINIC:  Medical Oncology/Hematology  PCP:  Curlene Labrum, MD [place] [address] [phone]   REASON FOR VISIT: Follow-up for breast cancer   CURRENT THERAPY: Anastrozole   INTERVAL HISTORY:  Laura Oliver 70 y.o. female returns for routine follow-up for breast cancer.  She was last seen in our clinic on 10/25/2019 where she denied any concerns.  In the interim, she continues to do well.  She denies any side effects. She denies any new lumps or bumps present she denies any new bone pain. Denies any nausea, vomiting, or diarrhea. Denies any new pains. Had not noticed any recent bleeding such as epistaxis, hematuria or hematochezia. Denies recent chest pain on exertion, shortness of breath on minimal exertion, pre-syncopal episodes, or palpitations. Denies any numbness or tingling in hands or feet. Denies any recent fevers, infections, or recent hospitalizations. Patient reports appetite at 100% and energy level at 75%.  She is eating well maintain her weight this time.   REVIEW OF SYSTEMS:  Review of Systems  All other systems reviewed and are negative.   PAST MEDICAL/SURGICAL HISTORY:  Past Medical History:  Diagnosis Date  . Breast cancer ([place]) 10/28/2016   left  . Diabetes mellitus without complication ([place])   . GERD (gastroesophageal reflux disease)   . High cholesterol   . Hypertension    Past Surgical History:  Procedure Laterality Date  . ANKLE ARTHROPLASTY    . BREAST LUMPECTOMY Left 10/28/2016  . TUBAL LIGATION       SOCIAL HISTORY:  Social History   Socioeconomic History  . Marital status: Married    Spouse name: Not on file  . Number of children: Not on file  . Years of education: Not on file  . Highest education level: Not on file  Occupational History  . Occupation: retired  Tobacco Use  . Smoking status: Never Smoker  . Smokeless tobacco: Never Used  Vaping  Use  . Vaping Use: Never used  Substance and Sexual Activity  . Alcohol use: Yes    Comment: prn beer  . Drug use: Never  . Sexual activity: Not on file  Other Topics Concern  . Not on file  Social History Narrative   From [place]   Recently retired and moved to [place] to be closer to her family.    Social Determinants of Health   Financial Resource Strain: Not on file  Food Insecurity: Not on file  Transportation Needs: Not on file  Physical Activity: Not on file  Stress: Not on file  Social Connections: Not on file  Intimate Partner Violence: Not on file    FAMILY HISTORY:  Family History  Problem Relation Age of Onset  . Cancer Daughter        breast  . Rheumatic fever Father   . Diabetes Brother   . Hypertension Brother     CURRENT MEDICATIONS:  Outpatient Encounter Medications as of 05/13/2020  Medication Sig  . ACCU-CHEK GUIDE test strip daily. for testing  . alendronate (FOSAMAX) 70 MG tablet TAKE 1 TABLET BY MOUTH ONCE A WEEK. TAKE WITH A FULL GLASS OF WATER ON AN EMPTY STOMACH  . amLODipine (NORVASC) 10 MG tablet Take 10 mg by mouth at bedtime.  Marland Kitchen anastrozole (ARIMIDEX) 1 MG tablet TAKE 1 TABLET(1 MG) BY MOUTH DAILY  . Calcium Carbonate (CALTRATE 600 PO) Take 1 tablet by mouth.  Marland Kitchen  Cholecalciferol (VITAMIN D3) 2000 units TABS Take 1 tablet by mouth.  . ezetimibe-simvastatin (VYTORIN) 10-40 MG tablet Take 1 tablet by mouth daily.  . metFORMIN (GLUCOPHAGE) 500 MG tablet Take by mouth daily with breakfast.  . Multiple Vitamins-Minerals (WOMENS MULTI VITAMIN & MINERAL PO) Take 1 tablet by mouth.  Marland Kitchen omeprazole (PRILOSEC) 20 MG capsule Take 20 mg by mouth daily.  . valsartan-hydrochlorothiazide (DIOVAN-HCT) 320-12.5 MG tablet Take 1 tablet by mouth daily.   No facility-administered encounter medications on file as of 05/13/2020.    ALLERGIES:  No Known Allergies   PHYSICAL EXAM:  ECOG Performance status: 1  Vitals:   05/13/20 1125  BP: 132/75  Pulse: 84   Resp: 18  Temp: (!) 97 F (36.1 C)  SpO2: 98%   Filed Weights   05/13/20 1125  Weight: 162 lb 14.4 oz (73.9 kg)   Physical Exam Constitutional:      Appearance: Normal appearance. She is normal weight.  Cardiovascular:     Rate and Rhythm: Normal rate and regular rhythm.     Heart sounds: Normal heart sounds.  Pulmonary:     Effort: Pulmonary effort is normal.     Breath sounds: Normal breath sounds.  Abdominal:     General: Bowel sounds are normal.     Palpations: Abdomen is soft.  Musculoskeletal:        General: Normal range of motion.  Skin:    General: Skin is warm.  Neurological:     Mental Status: She is alert and oriented to person, place, and time. Mental status is at baseline.  Psychiatric:        Mood and Affect: Mood normal.        Behavior: Behavior normal.        Thought Content: Thought content normal.        Judgment: Judgment normal.      LABORATORY DATA:  I have reviewed the labs as listed.  CBC    Component Value Date/Time   WBC 8.1 05/06/2020 1059   RBC 4.37 05/06/2020 1059   HGB 12.7 05/06/2020 1059   HCT 39.0 05/06/2020 1059   PLT 282 05/06/2020 1059   MCV 89.2 05/06/2020 1059   MCH 29.1 05/06/2020 1059   MCHC 32.6 05/06/2020 1059   RDW 14.5 05/06/2020 1059   LYMPHSABS 1.8 05/06/2020 1059   MONOABS 0.6 05/06/2020 1059   EOSABS 0.1 05/06/2020 1059   BASOSABS 0.1 05/06/2020 1059   CMP Latest Ref Rng & Units 05/06/2020 03/26/2019 11/20/2018  Glucose 70 - 99 mg/dL 114(H) 115(H) 113(H)  BUN 8 - 23 mg/dL $Remove'16 11 18  'AEDDLfp$ Creatinine 0.44 - 1.00 mg/dL 0.63 0.70 0.70  Sodium 135 - 145 mmol/L 137 140 139  Potassium 3.5 - 5.1 mmol/L 3.6 4.2 4.3  Chloride 98 - 111 mmol/L 99 101 101  CO2 22 - 32 mmol/L $RemoveB'26 27 28  'ZqcqcZaw$ Calcium 8.9 - 10.3 mg/dL 9.6 9.6 9.6  Total Protein 6.5 - 8.1 g/dL 8.2(H) 7.8 7.7  Total Bilirubin 0.3 - 1.2 mg/dL 0.5 0.9 0.7  Alkaline Phos 38 - 126 U/L 56 52 51  AST 15 - 41 U/L 38 43(H) 29  ALT 0 - 44 U/L 39 38 28    All questions  were answered to patient's stated satisfaction. Encouraged patient to call with any new concerns or questions before his next visit to the cancer center and we can certain see him sooner, if needed.     ASSESSMENT & PLAN:  1. Left  breast cancer: - Abnormal mammogram on April 2018. -Biopsy on 09/30/2016 of the left breast at 12 o'clock position.  Consistent with moderately differentiated IDC, ER/PR positive, HER-2 negative. - MRI showed 11 o'clock position left breast, 1.6 x 1.4 x 2.4 cm irregularly bordered enhancing mass. -She underwent left lumpectomy and sentinel lymph node biopsy on 10/28/2016. -Pathology showed moderately differentiated IDC, 0.9 cm associated with prior biopsy site.  An incidental well-differentiated IDC, 0.4 cm located about 5 mm posterior to the first lesion.  Low-grade DCIS was seen.  3 sentinel lymph nodes were negative.  Margins were negative. -She received 16 radiation treatments at [facility] breast center and in [place]. -Anastrozole was started in July 2018. -Today's physical examination did not reveal any changes in either breast.  Left breast lumpectomy site is unchanged.  There is a thickening felt below the scar.  No palpable adenopathy in bilateral axillary. -Her last mammogram was on 10/23/2019 showed B RADS category 2 benign.   -Labs from 05/06/2020 are unremarkable.  LDH is normal. - She is due for repeat mammogram in June 2022-orders placed  2.  Osteopenia: -Bone density test on 11/17/2016 showed T score of -2.4. -She was started on Fosamax 70 mg weekly by her PMD in [place]. She is tolerating it well. -She will continue to take calcium and vitamin D supplements daily -We repeated her DEXA scan on 11/20/2018 which showed a T score of -2.2. -She will be due for a repeat DEXA in July 2022-orders placed. -Vitamin D level on 05/06/2020 is 60.32  Disposition: - Repeat mammogram and bone density in 6 months. -RTC after imaging for results.   No  problem-specific Assessment & Plan notes found for this encounter.  Greater than 50% was spent in counseling and coordination of care with this patient including but not limited to discussion of the relevant topics above (See A&P) including, but not limited to diagnosis and management of acute and chronic medical conditions.     Orders placed this encounter:  Orders Placed This Encounter  Procedures  . MM DIAG BREAST TOMO BILATERAL  . DG Bone Density    Faythe Casa, NP 05/13/2020 12:29 PM  [place] [phone]

## 2020-05-14 DIAGNOSIS — E1169 Type 2 diabetes mellitus with other specified complication: Secondary | ICD-10-CM | POA: Diagnosis not present

## 2020-05-14 DIAGNOSIS — K219 Gastro-esophageal reflux disease without esophagitis: Secondary | ICD-10-CM | POA: Diagnosis not present

## 2020-05-14 DIAGNOSIS — E7800 Pure hypercholesterolemia, unspecified: Secondary | ICD-10-CM | POA: Diagnosis not present

## 2020-05-14 DIAGNOSIS — E782 Mixed hyperlipidemia: Secondary | ICD-10-CM | POA: Diagnosis not present

## 2020-05-14 DIAGNOSIS — I10 Essential (primary) hypertension: Secondary | ICD-10-CM | POA: Diagnosis not present

## 2020-06-19 DIAGNOSIS — Z6830 Body mass index (BMI) 30.0-30.9, adult: Secondary | ICD-10-CM | POA: Diagnosis not present

## 2020-06-19 DIAGNOSIS — I10 Essential (primary) hypertension: Secondary | ICD-10-CM | POA: Diagnosis not present

## 2020-06-19 DIAGNOSIS — E1169 Type 2 diabetes mellitus with other specified complication: Secondary | ICD-10-CM | POA: Diagnosis not present

## 2020-06-19 DIAGNOSIS — C50911 Malignant neoplasm of unspecified site of right female breast: Secondary | ICD-10-CM | POA: Diagnosis not present

## 2020-06-19 DIAGNOSIS — M8580 Other specified disorders of bone density and structure, unspecified site: Secondary | ICD-10-CM | POA: Diagnosis not present

## 2020-06-19 DIAGNOSIS — E7849 Other hyperlipidemia: Secondary | ICD-10-CM | POA: Diagnosis not present

## 2020-06-19 DIAGNOSIS — I5030 Unspecified diastolic (congestive) heart failure: Secondary | ICD-10-CM | POA: Diagnosis not present

## 2020-06-25 DIAGNOSIS — Z1212 Encounter for screening for malignant neoplasm of rectum: Secondary | ICD-10-CM | POA: Diagnosis not present

## 2020-06-25 DIAGNOSIS — Z1211 Encounter for screening for malignant neoplasm of colon: Secondary | ICD-10-CM | POA: Diagnosis not present

## 2020-07-29 ENCOUNTER — Other Ambulatory Visit (HOSPITAL_COMMUNITY): Payer: Self-pay | Admitting: Hematology and Oncology

## 2020-07-30 DIAGNOSIS — Z6830 Body mass index (BMI) 30.0-30.9, adult: Secondary | ICD-10-CM | POA: Diagnosis not present

## 2020-07-30 DIAGNOSIS — R195 Other fecal abnormalities: Secondary | ICD-10-CM | POA: Diagnosis not present

## 2020-09-04 DIAGNOSIS — Z7984 Long term (current) use of oral hypoglycemic drugs: Secondary | ICD-10-CM | POA: Diagnosis not present

## 2020-09-04 DIAGNOSIS — I509 Heart failure, unspecified: Secondary | ICD-10-CM | POA: Diagnosis not present

## 2020-09-04 DIAGNOSIS — E119 Type 2 diabetes mellitus without complications: Secondary | ICD-10-CM | POA: Diagnosis not present

## 2020-09-04 DIAGNOSIS — K219 Gastro-esophageal reflux disease without esophagitis: Secondary | ICD-10-CM | POA: Diagnosis not present

## 2020-09-04 DIAGNOSIS — K620 Anal polyp: Secondary | ICD-10-CM | POA: Diagnosis not present

## 2020-09-04 DIAGNOSIS — I110 Hypertensive heart disease with heart failure: Secondary | ICD-10-CM | POA: Diagnosis not present

## 2020-09-04 DIAGNOSIS — D128 Benign neoplasm of rectum: Secondary | ICD-10-CM | POA: Diagnosis not present

## 2020-09-04 DIAGNOSIS — R195 Other fecal abnormalities: Secondary | ICD-10-CM | POA: Diagnosis not present

## 2020-09-04 DIAGNOSIS — Z79811 Long term (current) use of aromatase inhibitors: Secondary | ICD-10-CM | POA: Diagnosis not present

## 2020-09-04 DIAGNOSIS — I5032 Chronic diastolic (congestive) heart failure: Secondary | ICD-10-CM | POA: Diagnosis not present

## 2020-09-04 DIAGNOSIS — Z7983 Long term (current) use of bisphosphonates: Secondary | ICD-10-CM | POA: Diagnosis not present

## 2020-09-04 DIAGNOSIS — E785 Hyperlipidemia, unspecified: Secondary | ICD-10-CM | POA: Diagnosis not present

## 2020-09-16 ENCOUNTER — Other Ambulatory Visit (HOSPITAL_COMMUNITY): Payer: Self-pay | Admitting: Oncology

## 2020-09-16 DIAGNOSIS — Z9889 Other specified postprocedural states: Secondary | ICD-10-CM

## 2020-09-24 DIAGNOSIS — K621 Rectal polyp: Secondary | ICD-10-CM | POA: Diagnosis not present

## 2020-09-26 ENCOUNTER — Other Ambulatory Visit (HOSPITAL_COMMUNITY): Payer: Self-pay | Admitting: Hematology

## 2020-10-28 ENCOUNTER — Ambulatory Visit (HOSPITAL_COMMUNITY)
Admission: RE | Admit: 2020-10-28 | Discharge: 2020-10-28 | Disposition: A | Discharge status: Home / Self Care | Payer: Medicare Other | Source: Ambulatory Visit | Attending: Oncology | Admitting: Oncology

## 2020-10-28 ENCOUNTER — Other Ambulatory Visit: Payer: Self-pay

## 2020-10-28 DIAGNOSIS — Z780 Asymptomatic menopausal state: Secondary | ICD-10-CM | POA: Diagnosis not present

## 2020-10-28 DIAGNOSIS — R922 Inconclusive mammogram: Secondary | ICD-10-CM | POA: Diagnosis not present

## 2020-10-28 DIAGNOSIS — Z853 Personal history of malignant neoplasm of breast: Secondary | ICD-10-CM | POA: Insufficient documentation

## 2020-10-28 DIAGNOSIS — Z9889 Other specified postprocedural states: Secondary | ICD-10-CM | POA: Diagnosis not present

## 2020-10-28 DIAGNOSIS — Z7983 Long term (current) use of bisphosphonates: Secondary | ICD-10-CM | POA: Insufficient documentation

## 2020-10-28 DIAGNOSIS — M810 Age-related osteoporosis without current pathological fracture: Secondary | ICD-10-CM | POA: Diagnosis not present

## 2020-11-04 ENCOUNTER — Other Ambulatory Visit (HOSPITAL_COMMUNITY): Payer: Self-pay | Admitting: *Deleted

## 2020-11-04 DIAGNOSIS — Z853 Personal history of malignant neoplasm of breast: Secondary | ICD-10-CM

## 2020-11-04 DIAGNOSIS — E559 Vitamin D deficiency, unspecified: Secondary | ICD-10-CM

## 2020-11-04 DIAGNOSIS — M8580 Other specified disorders of bone density and structure, unspecified site: Secondary | ICD-10-CM

## 2020-11-04 DIAGNOSIS — D519 Vitamin B12 deficiency anemia, unspecified: Secondary | ICD-10-CM

## 2020-11-05 ENCOUNTER — Inpatient Hospital Stay (HOSPITAL_COMMUNITY): Payer: Medicare Other | Attending: Hematology

## 2020-11-05 ENCOUNTER — Other Ambulatory Visit: Payer: Self-pay

## 2020-11-05 DIAGNOSIS — Z833 Family history of diabetes mellitus: Secondary | ICD-10-CM | POA: Diagnosis not present

## 2020-11-05 DIAGNOSIS — Z79899 Other long term (current) drug therapy: Secondary | ICD-10-CM | POA: Insufficient documentation

## 2020-11-05 DIAGNOSIS — M8580 Other specified disorders of bone density and structure, unspecified site: Secondary | ICD-10-CM | POA: Diagnosis not present

## 2020-11-05 DIAGNOSIS — Z853 Personal history of malignant neoplasm of breast: Secondary | ICD-10-CM

## 2020-11-05 DIAGNOSIS — D519 Vitamin B12 deficiency anemia, unspecified: Secondary | ICD-10-CM

## 2020-11-05 DIAGNOSIS — Z803 Family history of malignant neoplasm of breast: Secondary | ICD-10-CM | POA: Insufficient documentation

## 2020-11-05 DIAGNOSIS — R7402 Elevation of levels of lactic acid dehydrogenase (LDH): Secondary | ICD-10-CM | POA: Diagnosis not present

## 2020-11-05 DIAGNOSIS — Z170 Estrogen receptor positive status [ER+]: Secondary | ICD-10-CM | POA: Diagnosis not present

## 2020-11-05 DIAGNOSIS — C50212 Malignant neoplasm of upper-inner quadrant of left female breast: Secondary | ICD-10-CM | POA: Diagnosis not present

## 2020-11-05 DIAGNOSIS — Z8249 Family history of ischemic heart disease and other diseases of the circulatory system: Secondary | ICD-10-CM | POA: Diagnosis not present

## 2020-11-05 DIAGNOSIS — Z7182 Exercise counseling: Secondary | ICD-10-CM | POA: Diagnosis not present

## 2020-11-05 DIAGNOSIS — Z79811 Long term (current) use of aromatase inhibitors: Secondary | ICD-10-CM | POA: Diagnosis not present

## 2020-11-05 DIAGNOSIS — R7401 Elevation of levels of liver transaminase levels: Secondary | ICD-10-CM | POA: Insufficient documentation

## 2020-11-05 DIAGNOSIS — E559 Vitamin D deficiency, unspecified: Secondary | ICD-10-CM

## 2020-11-05 LAB — COMPREHENSIVE METABOLIC PANEL
ALT: 49 U/L — ABNORMAL HIGH (ref 0–44)
AST: 59 U/L — ABNORMAL HIGH (ref 15–41)
Albumin: 4.2 g/dL (ref 3.5–5.0)
Alkaline Phosphatase: 59 U/L (ref 38–126)
Anion gap: 11 (ref 5–15)
BUN: 15 mg/dL (ref 8–23)
CO2: 26 mmol/L (ref 22–32)
Calcium: 9.2 mg/dL (ref 8.9–10.3)
Chloride: 100 mmol/L (ref 98–111)
Creatinine, Ser: 0.63 mg/dL (ref 0.44–1.00)
GFR, Estimated: >60 mL/min (ref >60)
Glucose, Bld: 114 mg/dL — ABNORMAL HIGH (ref 70–99)
Potassium: 3.7 mmol/L (ref 3.5–5.1)
Sodium: 137 mmol/L (ref 135–145)
Total Bilirubin: 0.7 mg/dL (ref 0.3–1.2)
Total Protein: 7.6 g/dL (ref 6.5–8.1)

## 2020-11-05 LAB — CBC WITH DIFFERENTIAL/PLATELET
Abs Immature Granulocytes: 0.06 10*3/uL (ref 0.00–0.07)
Basophils Absolute: 0.1 10*3/uL (ref 0.0–0.1)
Basophils Relative: 1 %
Eosinophils Absolute: 0.1 10*3/uL (ref 0.0–0.5)
Eosinophils Relative: 1 %
HCT: 38.3 % (ref 36.0–46.0)
Hemoglobin: 12.6 g/dL (ref 12.0–15.0)
Immature Granulocytes: 1 %
Lymphocytes Relative: 22 %
Lymphs Abs: 2 10*3/uL (ref 0.7–4.0)
MCH: 28.6 pg (ref 26.0–34.0)
MCHC: 32.9 g/dL (ref 30.0–36.0)
MCV: 86.8 fL (ref 80.0–100.0)
Monocytes Absolute: 0.7 10*3/uL (ref 0.1–1.0)
Monocytes Relative: 8 %
Neutro Abs: 6.2 10*3/uL (ref 1.7–7.7)
Neutrophils Relative %: 67 %
Platelets: 303 10*3/uL (ref 150–400)
RBC: 4.41 MIL/uL (ref 3.87–5.11)
RDW: 14.3 % (ref 11.5–15.5)
WBC: 9 10*3/uL (ref 4.0–10.5)
nRBC: 0 % (ref 0.0–0.2)

## 2020-11-05 LAB — VITAMIN D 25 HYDROXY (VIT D DEFICIENCY, FRACTURES): Vit D, 25-Hydroxy: 44.22 ng/mL (ref 30–100)

## 2020-11-05 LAB — LACTATE DEHYDROGENASE: LDH: 202 U/L — ABNORMAL HIGH (ref 98–192)

## 2020-11-05 LAB — VITAMIN B12: Vitamin B-12: 505 pg/mL (ref 180–914)

## 2020-11-09 NOTE — Progress Notes (Addendum)
[place], Laura Oliver   CLINIC:  Medical Oncology/Hematology  PCP:  Curlene Labrum, MD [place] [address] [phone]   REASON FOR VISIT:  Follow-up for left breast cancer  PRIOR THERAPY: Lumpectomy and sentinel lymph node biopsy (10/28/2016); radiation  CURRENT THERAPY: Anastrozole (started July 2018)  INTERVAL HISTORY:  Laura Oliver 70 y.o. female returns for routine follow-up of her history of left-sided breast cancer.  She was last seen in clinic by NP Faythe Casa on 05/13/2020.  At today's visit, she reports feeling well.  No recent hospitalizations, surgeries, or changes in baseline health status.  She denies any alarm symptoms that would suggest recurrence of breast cancer, such as chest pain, dyspnea on exertion, abdominal pain, persistent headaches, neurologic deficits, seizures, new bone pain, or new lumps or bumps.  No B symptoms such as unexplained fever/chills, night sweats, or unintentional weight loss.  No left arm lymphedema. Continues to take Fosamax, denies any recent fractures, bone pain, or jaw pain. Noted to have mild transaminitis, reports that she has a history of possible fatty liver disease.  She denies abdominal pain, nausea, vomiting, diarrhea. No current signs or symptoms of blood loss.  No current signs or symptoms of blood clots such as DVT or PE.  She has 90% energy and 100% appetite. She endorses that she is maintaining a stable weight.    REVIEW OF SYSTEMS:  Review of Systems  Constitutional:  Negative for appetite change, chills, diaphoresis, fatigue, fever and unexpected weight change.  HENT:   Negative for lump/mass and nosebleeds.   Eyes:  Negative for eye problems.  Respiratory:  Negative for cough, hemoptysis and shortness of breath.   Cardiovascular:  Negative for chest pain, leg swelling and palpitations.  Gastrointestinal:  Negative for abdominal pain, blood in stool,  constipation, diarrhea, nausea and vomiting.  Genitourinary:  Negative for hematuria.   Skin: Negative.   Neurological:  Negative for dizziness, headaches and light-headedness.  Hematological:  Does not bruise/bleed easily.  Psychiatric/Behavioral:  Positive for sleep disturbance.      PAST MEDICAL/SURGICAL HISTORY:  Past Medical History:  Diagnosis Date   Breast cancer ([place]) 10/28/2016   left   Diabetes mellitus without complication (HCC)    GERD (gastroesophageal reflux disease)    High cholesterol    Hypertension    Past Surgical History:  Procedure Laterality Date   ANKLE ARTHROPLASTY     BREAST LUMPECTOMY Left 10/28/2016   TUBAL LIGATION       SOCIAL HISTORY:  Social History   Socioeconomic History   Marital status: Married    Spouse name: Not on file   Number of children: Not on file   Years of education: Not on file   Highest education level: Not on file  Occupational History   Occupation: retired  Tobacco Use   Smoking status: Never   Smokeless tobacco: Never  Vaping Use   Vaping Use: Never used  Substance and Sexual Activity   Alcohol use: Yes    Comment: prn beer   Drug use: Never   Sexual activity: Not on file  Other Topics Concern   Not on file  Social History Narrative   From [place]   Recently retired and moved to [place] to be closer to her family.    Social Determinants of Health   Financial Resource Strain: Not on file  Food Insecurity: Not on file  Transportation Needs: Not on  file  Physical Activity: Not on file  Stress: Not on file  Social Connections: Not on file  Intimate Partner Violence: Not on file    FAMILY HISTORY:  Family History  Problem Relation Age of Onset   Cancer Daughter        breast   Rheumatic fever Father    Diabetes Brother    Hypertension Brother     CURRENT MEDICATIONS:  Outpatient Encounter Medications as of 11/10/2020  Medication Sig   ACCU-CHEK GUIDE test strip daily. for testing   alendronate  (FOSAMAX) 70 MG tablet TAKE 1 TABLET BY MOUTH 1 TIME A WEEK WITH A FULL GLASS OF WATER AND ON AN EMPTY STOMACH   amLODipine (NORVASC) 10 MG tablet Take 10 mg by mouth at bedtime.   anastrozole (ARIMIDEX) 1 MG tablet TAKE 1 TABLET(1 MG) BY MOUTH DAILY   Calcium Carbonate (CALTRATE 600 PO) Take 1 tablet by mouth.   Cholecalciferol (VITAMIN D3) 2000 units TABS Take 1 tablet by mouth.   ezetimibe-simvastatin (VYTORIN) 10-40 MG tablet Take 1 tablet by mouth daily.   metFORMIN (GLUCOPHAGE) 500 MG tablet Take by mouth daily with breakfast.   Multiple Vitamins-Minerals (WOMENS MULTI VITAMIN & MINERAL PO) Take 1 tablet by mouth.   omeprazole (PRILOSEC) 20 MG capsule Take 20 mg by mouth daily.   valsartan-hydrochlorothiazide (DIOVAN-HCT) 320-12.5 MG tablet Take 1 tablet by mouth daily.   No facility-administered encounter medications on file as of 11/10/2020.    ALLERGIES:  No Known Allergies   PHYSICAL EXAM:  ECOG PERFORMANCE STATUS: 0 - Asymptomatic  There were no vitals filed for this visit. There were no vitals filed for this visit. Physical Exam Constitutional:      Appearance: Normal appearance.  HENT:     Head: Normocephalic and atraumatic.     Mouth/Throat:     Mouth: Mucous membranes are moist.  Eyes:     Extraocular Movements: Extraocular movements intact.     Pupils: Pupils are equal, round, and reactive to light.  Cardiovascular:     Rate and Rhythm: Normal rate and regular rhythm.     Pulses: Normal pulses.     Heart sounds: Normal heart sounds.  Pulmonary:     Effort: Pulmonary effort is normal.     Breath sounds: Normal breath sounds.  Chest:    Abdominal:     General: Bowel sounds are normal.     Palpations: Abdomen is soft.     Tenderness: There is no abdominal tenderness.  Musculoskeletal:        General: No swelling.     Right lower leg: Edema (trace pitting ankle edema) present.     Left lower leg: Edema (trace pitting ankle edema) present.   Lymphadenopathy:     Cervical: No cervical adenopathy.  Skin:    General: Skin is warm and dry.  Neurological:     General: No focal deficit present.     Mental Status: She is alert and oriented to person, place, and time.  Psychiatric:        Mood and Affect: Mood normal.        Behavior: Behavior normal.     LABORATORY DATA:  I have reviewed the labs as listed.  CBC    Component Value Date/Time   WBC 9.0 11/05/2020 1046   RBC 4.41 11/05/2020 1046   HGB 12.6 11/05/2020 1046   HCT 38.3 11/05/2020 1046   PLT 303 11/05/2020 1046   MCV 86.8 11/05/2020 1046   MCH  28.6 11/05/2020 1046   MCHC 32.9 11/05/2020 1046   RDW 14.3 11/05/2020 1046   LYMPHSABS 2.0 11/05/2020 1046   MONOABS 0.7 11/05/2020 1046   EOSABS 0.1 11/05/2020 1046   BASOSABS 0.1 11/05/2020 1046   CMP Latest Ref Rng & Units 11/05/2020 05/06/2020 03/26/2019  Glucose 70 - 99 mg/dL 114(H) 114(H) 115(H)  BUN 8 - 23 mg/dL 15 16 11  Creatinine 0.44 - 1.00 mg/dL 0.63 0.63 0.70  Sodium 135 - 145 mmol/L 137 137 140  Potassium 3.5 - 5.1 mmol/L 3.7 3.6 4.2  Chloride 98 - 111 mmol/L 100 99 101  CO2 22 - 32 mmol/L 26 26 27  Calcium 8.9 - 10.3 mg/dL 9.2 9.6 9.6  Total Protein 6.5 - 8.1 g/dL 7.6 8.2(H) 7.8  Total Bilirubin 0.3 - 1.2 mg/dL 0.7 0.5 0.9  Alkaline Phos 38 - 126 U/L 59 56 52  AST 15 - 41 U/L 59(H) 38 43(H)  ALT 0 - 44 U/L 49(H) 39 38    DIAGNOSTIC IMAGING:  I have independently reviewed the relevant imaging and discussed with the patient.  ASSESSMENT & PLAN: 1. Left breast cancer: - Abnormal mammogram on April 2018. -Biopsy on 09/30/2016 of the left breast at 12 o'clock position.  Consistent with moderately differentiated IDC, ER/PR positive, HER-2 negative. - MRI showed 11 o'clock position left breast, 1.6 x 1.4 x 2.4 cm irregularly bordered enhancing mass. -She underwent left lumpectomy and sentinel lymph node biopsy on 10/28/2016. -Pathology showed moderately differentiated IDC, 0.9 cm associated with  prior biopsy site.  An incidental well-differentiated IDC, 0.4 cm located about 5 mm posterior to the first lesion.  Low-grade DCIS was seen.  3 sentinel lymph nodes were negative.  Margins were negative. - She received 16 radiation treatments at [facility] and in [place]. - Anastrozole was started in July 2018 - after 5 years (July 2023), we will check BCI to determine if patient would benefit from 10-year treatment regimen - Last mammogram (10/28/2020): BI-RADS Category 2, benign - Most recent labs (11/05/2020): CBC unremarkable, mild transaminitis on CMP (AST 59, ALT 49); LDH mildly elevated at 202 -Today's physical examination did not reveal any changes in either breast.  Left breast lumpectomy site is unchanged.  There is a thickening felt below the scar.  No palpable adenopathy in bilateral axillary. - PLAN: Obtain liver ultrasound due to mild transaminitis (will call with results).   If normal, we will transition to annual visits.  Next mammogram in June 2023, RTC after mammogram in 1 year.  Check BCI.  2.  Osteopenia: -Bone density test on 11/17/2016 showed T score of -2.4. -She was started on Fosamax 70 mg weekly by her PMD in [place]. She is tolerating it well. -We repeated her DEXA scan on 11/20/2018 which showed a T score of -2.2. - Most recent DEXA scan (10/28/2020): T score -2.3 - Most recent vitamin D level (11/05/2020): 44.2, normal - Discussed with patient the need to continue on calcium and vitamin D supplementation.  Encouraged weightbearing exercises to strengthen bones.  - PLAN:  Repeat DEXA scan in 2 years (June 2024).  Continue Fosamax, calcium, and vitamin D supplements.   PLAN SUMMARY & DISPOSITION: -Check liver ultrasound (will call patient with results) - Check BCI to determine length of aromatase inhibitor treatment - Mammogram, labs, and RTC in 1 year  All questions were answered. The patient knows to call the clinic with any problems, questions or  concerns.  Medical decision making:   Low  Time spent on visit: I spent 15 minutes counseling the patient face to face. The total time spent in the appointment was 25 minutes and more than 50% was on counseling.   Rebekah M Pennington, PA-C  11/10/2020 11:25 AM   ADDENDUM: (11/28/2020) Breast cancer index test results have been received and reviewed by me. Patient has 2.4% risk of late distant recurrence. She is unlikely to benefit from extended endocrine therapy, and therefore her endocrine therapy can be discontinued after she completes her 5 years of treatment. 

## 2020-11-10 ENCOUNTER — Inpatient Hospital Stay (HOSPITAL_BASED_OUTPATIENT_CLINIC_OR_DEPARTMENT_OTHER): Payer: Medicare Other | Admitting: Physician Assistant

## 2020-11-10 ENCOUNTER — Encounter (HOSPITAL_COMMUNITY): Payer: Self-pay

## 2020-11-10 ENCOUNTER — Other Ambulatory Visit: Payer: Self-pay

## 2020-11-10 VITALS — BP 128/68 | HR 82 | Temp 98.6°F | Resp 16 | Wt 164.0 lb

## 2020-11-10 DIAGNOSIS — R7401 Elevation of levels of liver transaminase levels: Secondary | ICD-10-CM

## 2020-11-10 DIAGNOSIS — R7402 Elevation of levels of lactic acid dehydrogenase (LDH): Secondary | ICD-10-CM | POA: Diagnosis not present

## 2020-11-10 DIAGNOSIS — C50212 Malignant neoplasm of upper-inner quadrant of left female breast: Secondary | ICD-10-CM | POA: Diagnosis not present

## 2020-11-10 DIAGNOSIS — M8580 Other specified disorders of bone density and structure, unspecified site: Secondary | ICD-10-CM

## 2020-11-10 DIAGNOSIS — Z1239 Encounter for other screening for malignant neoplasm of breast: Secondary | ICD-10-CM

## 2020-11-10 DIAGNOSIS — Z1231 Encounter for screening mammogram for malignant neoplasm of breast: Secondary | ICD-10-CM | POA: Diagnosis not present

## 2020-11-10 DIAGNOSIS — Z853 Personal history of malignant neoplasm of breast: Secondary | ICD-10-CM | POA: Diagnosis not present

## 2020-11-10 DIAGNOSIS — E559 Vitamin D deficiency, unspecified: Secondary | ICD-10-CM | POA: Diagnosis not present

## 2020-11-10 DIAGNOSIS — Z7182 Exercise counseling: Secondary | ICD-10-CM | POA: Diagnosis not present

## 2020-11-10 DIAGNOSIS — Z79811 Long term (current) use of aromatase inhibitors: Secondary | ICD-10-CM | POA: Diagnosis not present

## 2020-11-10 DIAGNOSIS — Z170 Estrogen receptor positive status [ER+]: Secondary | ICD-10-CM | POA: Diagnosis not present

## 2020-11-10 NOTE — Progress Notes (Signed)
Breast Cancer Index by [employer] has been requested per Tarri Abernethy, PA.

## 2020-11-10 NOTE — Patient Instructions (Signed)
[facility] Discharge Instructions  You were seen today by Tarri Abernethy PA-C for your history of breast cancer.  You most recent mammogram and labs were normal.  The only abnormality was a mild elevation in your liver enzymes.  We will check ultrasound of your liver to rule out any major abnormalities (we will call you with results).  At this time, we can change your visits to once a year, but you can call us to return sooner if needed.  LABS: Return in 1 year for labs  OTHER TESTS: Repeat mammogram in June 2023  MEDICATIONS: Continue anastrozole  FOLLOW-UP APPOINTMENT: Office visit in 1 year, after mammogram   Thank you for choosing [facility] to provide your oncology and hematology care.  To afford each patient quality time with our provider, please arrive at least 15 minutes before your scheduled appointment time.   If you have a lab appointment with the [facility] please come in thru the Main Entrance and check in at the main information desk.  You need to re-schedule your appointment should you arrive 10 or more minutes late.  We strive to give you quality time with our providers, and arriving late affects you and other patients whose appointments are after yours.  Also, if you no show three or more times for appointments you may be dismissed from the clinic at the providers discretion.     Again, thank you for choosing [facility].  Our hope is that these requests will decrease the amount of time that you wait before being seen by our physicians.       _____________________________________________________________  Should you have questions after your visit to [facility], please contact our office at [phone] and follow the prompts.  Our office hours are 8:00 a.m. and 4:30 p.m. Monday - Friday.  Please note that voicemails left after 4:00 p.m. may not be returned until the  following business day.  We are closed weekends and major holidays.  You do have access to a nurse 24-7, just call the main number to the clinic [phone] and do not press any options, hold on the line and a nurse will answer the phone.    For prescription refill requests, have your pharmacy contact our office and allow 72 hours.    Due to Covid, you will need to wear a mask upon entering the hospital. If you do not have a mask, a mask will be given to you at the Main Entrance upon arrival. For doctor visits, patients may have 1 support person age 49 or older with them. For treatment visits, patients can not have anyone with them due to social distancing guidelines and our immunocompromised population.

## 2020-11-11 ENCOUNTER — Ambulatory Visit (HOSPITAL_COMMUNITY): Payer: Medicare Other | Admitting: Physician Assistant

## 2020-11-16 ENCOUNTER — Other Ambulatory Visit (HOSPITAL_COMMUNITY): Payer: Self-pay | Admitting: Hematology and Oncology

## 2020-11-17 ENCOUNTER — Other Ambulatory Visit: Payer: Self-pay

## 2020-11-17 ENCOUNTER — Ambulatory Visit (HOSPITAL_COMMUNITY)
Admission: RE | Admit: 2020-11-17 | Discharge: 2020-11-17 | Disposition: A | Discharge status: Home / Self Care | Payer: Medicare Other | Source: Ambulatory Visit | Attending: Physician Assistant | Admitting: Physician Assistant

## 2020-11-17 DIAGNOSIS — R7401 Elevation of levels of liver transaminase levels: Secondary | ICD-10-CM | POA: Insufficient documentation

## 2020-11-17 DIAGNOSIS — K760 Fatty (change of) liver, not elsewhere classified: Secondary | ICD-10-CM | POA: Diagnosis not present

## 2020-11-17 NOTE — Progress Notes (Signed)
Please call patient to inform her of results.  We have checked abdominal ultrasound due to history of breast cancer with recently noted transaminitis.  There were no signs of any cancer recurrence on upper abdominal ultrasound.  Findings were consistent with fatty liver disease and benign cyst.  She can continue to follow-up with her primary care provider for these findings.  We will see her for breast cancer follow-up in 1 year as previously scheduled.

## 2020-11-18 ENCOUNTER — Encounter (HOSPITAL_COMMUNITY): Payer: Self-pay | Admitting: *Deleted

## 2020-11-24 DIAGNOSIS — Z170 Estrogen receptor positive status [ER+]: Secondary | ICD-10-CM | POA: Diagnosis not present

## 2020-11-24 DIAGNOSIS — C50812 Malignant neoplasm of overlapping sites of left female breast: Secondary | ICD-10-CM | POA: Diagnosis not present

## 2020-11-28 ENCOUNTER — Encounter (HOSPITAL_COMMUNITY): Payer: Self-pay

## 2020-12-01 DIAGNOSIS — Z23 Encounter for immunization: Secondary | ICD-10-CM | POA: Diagnosis not present

## 2020-12-09 DIAGNOSIS — E1169 Type 2 diabetes mellitus with other specified complication: Secondary | ICD-10-CM | POA: Diagnosis not present

## 2020-12-09 DIAGNOSIS — I10 Essential (primary) hypertension: Secondary | ICD-10-CM | POA: Diagnosis not present

## 2020-12-09 DIAGNOSIS — E782 Mixed hyperlipidemia: Secondary | ICD-10-CM | POA: Diagnosis not present

## 2020-12-09 DIAGNOSIS — E7801 Familial hypercholesterolemia: Secondary | ICD-10-CM | POA: Diagnosis not present

## 2020-12-09 DIAGNOSIS — K219 Gastro-esophageal reflux disease without esophagitis: Secondary | ICD-10-CM | POA: Diagnosis not present

## 2020-12-09 DIAGNOSIS — E7849 Other hyperlipidemia: Secondary | ICD-10-CM | POA: Diagnosis not present

## 2020-12-09 DIAGNOSIS — E7800 Pure hypercholesterolemia, unspecified: Secondary | ICD-10-CM | POA: Diagnosis not present

## 2020-12-09 DIAGNOSIS — Z1329 Encounter for screening for other suspected endocrine disorder: Secondary | ICD-10-CM | POA: Diagnosis not present

## 2020-12-12 DIAGNOSIS — I5030 Unspecified diastolic (congestive) heart failure: Secondary | ICD-10-CM | POA: Diagnosis not present

## 2020-12-12 DIAGNOSIS — M8580 Other specified disorders of bone density and structure, unspecified site: Secondary | ICD-10-CM | POA: Diagnosis not present

## 2020-12-12 DIAGNOSIS — E1169 Type 2 diabetes mellitus with other specified complication: Secondary | ICD-10-CM | POA: Diagnosis not present

## 2020-12-12 DIAGNOSIS — C50911 Malignant neoplasm of unspecified site of right female breast: Secondary | ICD-10-CM | POA: Diagnosis not present

## 2020-12-12 DIAGNOSIS — D126 Benign neoplasm of colon, unspecified: Secondary | ICD-10-CM | POA: Diagnosis not present

## 2020-12-12 DIAGNOSIS — I10 Essential (primary) hypertension: Secondary | ICD-10-CM | POA: Diagnosis not present

## 2020-12-12 DIAGNOSIS — E7849 Other hyperlipidemia: Secondary | ICD-10-CM | POA: Diagnosis not present

## 2020-12-12 DIAGNOSIS — Z6831 Body mass index (BMI) 31.0-31.9, adult: Secondary | ICD-10-CM | POA: Diagnosis not present

## 2021-01-27 DIAGNOSIS — Z6831 Body mass index (BMI) 31.0-31.9, adult: Secondary | ICD-10-CM | POA: Diagnosis not present

## 2021-01-27 DIAGNOSIS — M25462 Effusion, left knee: Secondary | ICD-10-CM | POA: Diagnosis not present

## 2021-02-20 DIAGNOSIS — Z23 Encounter for immunization: Secondary | ICD-10-CM | POA: Diagnosis not present

## 2021-03-05 ENCOUNTER — Other Ambulatory Visit (HOSPITAL_COMMUNITY): Payer: Self-pay | Admitting: Physician Assistant

## 2021-06-12 DIAGNOSIS — K219 Gastro-esophageal reflux disease without esophagitis: Secondary | ICD-10-CM | POA: Diagnosis not present

## 2021-06-12 DIAGNOSIS — I10 Essential (primary) hypertension: Secondary | ICD-10-CM | POA: Diagnosis not present

## 2021-06-12 DIAGNOSIS — E782 Mixed hyperlipidemia: Secondary | ICD-10-CM | POA: Diagnosis not present

## 2021-06-12 DIAGNOSIS — E119 Type 2 diabetes mellitus without complications: Secondary | ICD-10-CM | POA: Diagnosis not present

## 2021-06-12 DIAGNOSIS — E7849 Other hyperlipidemia: Secondary | ICD-10-CM | POA: Diagnosis not present

## 2021-06-17 DIAGNOSIS — D126 Benign neoplasm of colon, unspecified: Secondary | ICD-10-CM | POA: Diagnosis not present

## 2021-06-17 DIAGNOSIS — E7849 Other hyperlipidemia: Secondary | ICD-10-CM | POA: Diagnosis not present

## 2021-06-17 DIAGNOSIS — C50911 Malignant neoplasm of unspecified site of right female breast: Secondary | ICD-10-CM | POA: Diagnosis not present

## 2021-06-17 DIAGNOSIS — I10 Essential (primary) hypertension: Secondary | ICD-10-CM | POA: Diagnosis not present

## 2021-06-17 DIAGNOSIS — I5030 Unspecified diastolic (congestive) heart failure: Secondary | ICD-10-CM | POA: Diagnosis not present

## 2021-06-17 DIAGNOSIS — Z0001 Encounter for general adult medical examination with abnormal findings: Secondary | ICD-10-CM | POA: Diagnosis not present

## 2021-06-17 DIAGNOSIS — M8580 Other specified disorders of bone density and structure, unspecified site: Secondary | ICD-10-CM | POA: Diagnosis not present

## 2021-06-17 DIAGNOSIS — E1169 Type 2 diabetes mellitus with other specified complication: Secondary | ICD-10-CM | POA: Diagnosis not present

## 2021-06-21 ENCOUNTER — Other Ambulatory Visit (HOSPITAL_COMMUNITY): Payer: Self-pay | Admitting: Physician Assistant

## 2021-09-17 ENCOUNTER — Other Ambulatory Visit (HOSPITAL_COMMUNITY): Payer: Self-pay | Admitting: Hematology

## 2021-10-16 ENCOUNTER — Other Ambulatory Visit (HOSPITAL_COMMUNITY): Payer: Self-pay | Admitting: Physician Assistant

## 2021-10-29 ENCOUNTER — Inpatient Hospital Stay (HOSPITAL_COMMUNITY): Payer: Medicare Other | Attending: Hematology

## 2021-10-29 ENCOUNTER — Ambulatory Visit (HOSPITAL_COMMUNITY)
Admission: RE | Admit: 2021-10-29 | Discharge: 2021-10-29 | Disposition: A | Discharge status: Home / Self Care | Payer: Medicare Other | Source: Ambulatory Visit | Attending: Physician Assistant | Admitting: Physician Assistant

## 2021-10-29 DIAGNOSIS — M8580 Other specified disorders of bone density and structure, unspecified site: Secondary | ICD-10-CM | POA: Insufficient documentation

## 2021-10-29 DIAGNOSIS — Z1231 Encounter for screening mammogram for malignant neoplasm of breast: Secondary | ICD-10-CM

## 2021-10-29 DIAGNOSIS — Z853 Personal history of malignant neoplasm of breast: Secondary | ICD-10-CM | POA: Insufficient documentation

## 2021-10-29 DIAGNOSIS — Z79811 Long term (current) use of aromatase inhibitors: Secondary | ICD-10-CM | POA: Insufficient documentation

## 2021-10-29 DIAGNOSIS — E559 Vitamin D deficiency, unspecified: Secondary | ICD-10-CM | POA: Diagnosis not present

## 2021-10-29 DIAGNOSIS — Z1239 Encounter for other screening for malignant neoplasm of breast: Secondary | ICD-10-CM

## 2021-10-29 LAB — CBC WITH DIFFERENTIAL/PLATELET
Abs Immature Granulocytes: 0.05 10*3/uL (ref 0.00–0.07)
Basophils Absolute: 0.1 10*3/uL (ref 0.0–0.1)
Basophils Relative: 1 %
Eosinophils Absolute: 0.3 10*3/uL (ref 0.0–0.5)
Eosinophils Relative: 4 %
HCT: 34.8 % — ABNORMAL LOW (ref 36.0–46.0)
Hemoglobin: 11 g/dL — ABNORMAL LOW (ref 12.0–15.0)
Immature Granulocytes: 1 %
Lymphocytes Relative: 22 %
Lymphs Abs: 1.7 10*3/uL (ref 0.7–4.0)
MCH: 25.3 pg — ABNORMAL LOW (ref 26.0–34.0)
MCHC: 31.6 g/dL (ref 30.0–36.0)
MCV: 80 fL (ref 80.0–100.0)
Monocytes Absolute: 0.7 10*3/uL (ref 0.1–1.0)
Monocytes Relative: 9 %
Neutro Abs: 4.8 10*3/uL (ref 1.7–7.7)
Neutrophils Relative %: 63 %
Platelets: 303 10*3/uL (ref 150–400)
RBC: 4.35 MIL/uL (ref 3.87–5.11)
RDW: 16.9 % — ABNORMAL HIGH (ref 11.5–15.5)
WBC: 7.6 10*3/uL (ref 4.0–10.5)
nRBC: 0 % (ref 0.0–0.2)

## 2021-10-29 LAB — COMPREHENSIVE METABOLIC PANEL
ALT: 39 U/L (ref 0–44)
AST: 49 U/L — ABNORMAL HIGH (ref 15–41)
Albumin: 3.8 g/dL (ref 3.5–5.0)
Alkaline Phosphatase: 55 U/L (ref 38–126)
Anion gap: 7 (ref 5–15)
BUN: 15 mg/dL (ref 8–23)
CO2: 26 mmol/L (ref 22–32)
Calcium: 8.7 mg/dL — ABNORMAL LOW (ref 8.9–10.3)
Chloride: 105 mmol/L (ref 98–111)
Creatinine, Ser: 0.62 mg/dL (ref 0.44–1.00)
GFR, Estimated: >60 mL/min (ref >60)
Glucose, Bld: 145 mg/dL — ABNORMAL HIGH (ref 70–99)
Potassium: 3.6 mmol/L (ref 3.5–5.1)
Sodium: 138 mmol/L (ref 135–145)
Total Bilirubin: 0.7 mg/dL (ref 0.3–1.2)
Total Protein: 7.3 g/dL (ref 6.5–8.1)

## 2021-10-29 LAB — VITAMIN D 25 HYDROXY (VIT D DEFICIENCY, FRACTURES): Vit D, 25-Hydroxy: 64.41 ng/mL (ref 30–100)

## 2021-10-29 LAB — LACTATE DEHYDROGENASE: LDH: 182 U/L (ref 98–192)

## 2021-11-04 NOTE — Progress Notes (Unsigned)
Laura Oliver, [place] [id number]   CLINIC:  Medical Oncology/Hematology  PCP:  Laura Labrum, MD [address] [place] [address] [phone]   REASON FOR VISIT:  Follow-up for left breast cancer   PRIOR THERAPY: Lumpectomy and sentinel lymph node biopsy (10/28/2016); radiation   CURRENT THERAPY: Anastrozole (started July 2018)  BRIEF ONCOLOGIC HISTORY:  Oncology History  History of left breast cancer  06/28/2018 Initial Diagnosis   History of left breast cancer   11/24/2020 Genetic Testing   BCI Testing Results:       CANCER STAGING: Cancer Staging  No matching staging information was found for the patient.  INTERVAL HISTORY:  Laura Oliver, a 71 y.o. female, returns for routine follow-up of her history of left-sided breast cancer. Laura Oliver was last seen on 11/10/2020 by Laura Abernethy, PA-C.   At today's visit, she  reports feeling well.  She denies any recent hospitalizations, surgeries, or changes in her  baseline health status.  She does report that her daughter, who was diagnosed with stage II breast cancer at age 33, now has recurrent metastatic breast cancer at age 82, possibly related to her daughter stopping tamoxifen prematurely.  Most recent mammogram on 10/30/2021 showed no mammographic evidence of malignancy.  She denies any symptoms of recurrence such as new lumps, bone pain, chest pain, dyspnea, or abdominal pain.  She has no new headaches, seizures, or focal neurologic deficits.  She has occasional night sweats.  No B symptoms such as fever, chills, unintentional weight loss.  She does not have any lymphedema of her affected side (left side).   She continues to take Arimidex and is tolerating it well.  She does have occasional night sweats and unintentional weight gain.  She continues to take Fosamax and denies any recent fractures, bone pain, or jaw pain.  Most recent labs showed mild normocytic anemia.   Patient denies any bright red blood per rectum or melena.  She reports that her energy is at baseline, with some mild chronic fatigue.  She reports lifelong habit of eating ice but denies any new or abnormal cravings.  She has leg cramps.  No headaches, lightheadedness, or syncope.  She has never had an EGD.  Colonoscopy performed in May 2022 with rectal polyp (tubular adenoma), with recommendations for repeat colonoscopy in 5 years.  She has never had any blood transfusions.  She does not take iron tablet at home.  She reports 50% energy and 100% appetite.  She is maintaining stable weight at this time.   REVIEW OF SYSTEMS:   Review of Systems  Constitutional:  Positive for fatigue. Negative for appetite change, chills, diaphoresis, fever and unexpected weight change.  HENT:   Negative for lump/mass and nosebleeds.   Eyes:  Negative for eye problems.  Respiratory:  Negative for cough, hemoptysis and shortness of breath.   Cardiovascular:  Negative for chest pain, leg swelling and palpitations.  Gastrointestinal:  Negative for abdominal pain, blood in stool, constipation, diarrhea, nausea and vomiting.  Genitourinary:  Negative for hematuria.   Skin: Negative.   Neurological:  Positive for numbness. Negative for dizziness, headaches and light-headedness.  Hematological:  Does not bruise/bleed easily.  Psychiatric/Behavioral:  Positive for sleep disturbance.     PAST MEDICAL/SURGICAL HISTORY:  Past Medical History:  Diagnosis Date   Breast cancer ([facility]) 10/28/2016   left   Diabetes mellitus without complication (HCC)    GERD (gastroesophageal reflux disease)  High cholesterol    Hypertension    Past Surgical History:  Procedure Laterality Date   ANKLE ARTHROPLASTY     BREAST LUMPECTOMY Left 10/28/2016   TUBAL LIGATION      SOCIAL HISTORY:  Social History   Socioeconomic History   Marital status: Married    Spouse name: Not on file   Number of children: Not on file   Years  of education: Not on file   Highest education level: Not on file  Occupational History   Occupation: retired  Tobacco Use   Smoking status: Never   Smokeless tobacco: Never  Vaping Use   Vaping Use: Never used  Substance and Sexual Activity   Alcohol use: Yes    Comment: prn beer   Drug use: Never   Sexual activity: Not on file  Other Topics Concern   Not on file  Social History Narrative   From [place]   Recently retired and moved to [place] to be closer to her family.    Social Determinants of Health   Financial Resource Strain: Low Risk  (03/01/2018)   Overall Financial Resource Strain (CARDIA)    Difficulty of Paying Living Expenses: Not hard at all  Food Insecurity: No Food Insecurity (03/01/2018)   Hunger Vital Sign    Worried About Running Out of Food in the Last Year: Never true    Ran Out of Food in the Last Year: Never true  Transportation Needs: No Transportation Needs (03/01/2018)   PRAPARE - Hydrologist (Medical): No    Lack of Transportation (Non-Medical): No  Physical Activity: Not on file  Stress: No Stress Concern Present (03/01/2018)   Laura Oliver    Feeling of Stress : Not at all  Social Connections: Unknown (03/01/2018)   Social Connection and Isolation Panel [NHANES]    Frequency of Communication with Friends and Family: Not asked    Frequency of Social Gatherings with Friends and Family: Not on file    Attends Religious Services: Not on file    Active Member of Clubs or Organizations: Not on file    Attends Archivist Meetings: Not on file    Marital Status: Not on file  Intimate Partner Violence: Not on file    FAMILY HISTORY:  Family History  Problem Relation Age of Onset   Cancer Daughter        breast   Rheumatic fever Father    Diabetes Brother    Hypertension Brother     CURRENT MEDICATIONS:  Current Outpatient Medications   Medication Sig Dispense Refill   ACCU-CHEK GUIDE test strip daily. for testing     alendronate (FOSAMAX) 70 MG tablet TAKE 1 TABLET BY MOUTH 1 TIME A WEEK WITH A FULL GLASS OF WATER AND ON AN EMPTY STOMACH 4 tablet 3   amLODipine (NORVASC) 10 MG tablet Take 10 mg by mouth at bedtime.     anastrozole (ARIMIDEX) 1 MG tablet TAKE 1 TABLET(1 MG) BY MOUTH DAILY 30 tablet 11   Calcium Carbonate (CALTRATE 600 PO) Take 1 tablet by mouth.     Cholecalciferol (VITAMIN D3) 2000 units TABS Take 1 tablet by mouth.     ezetimibe-simvastatin (VYTORIN) 10-40 MG tablet Take 1 tablet by mouth daily.     metFORMIN (GLUCOPHAGE) 500 MG tablet Take by mouth daily with breakfast.     metoprolol tartrate (LOPRESSOR) 25 MG tablet Take 25 mg by mouth  2 (two) times daily.     Multiple Vitamins-Minerals (WOMENS MULTI VITAMIN & MINERAL PO) Take 1 tablet by mouth.     omeprazole (PRILOSEC) 20 MG capsule Take 20 mg by mouth daily.     valsartan-hydrochlorothiazide (DIOVAN-HCT) 320-25 MG tablet TAKE 1 TABLET BY MOUTH EVERY DAY FOR BLOOD PRESSURE     No current facility-administered medications for this visit.    ALLERGIES:  No Known Allergies  PHYSICAL EXAM:  Performance status (ECOG): 0 - Asymptomatic  There were no vitals filed for this visit. Wt Readings from Last 3 Encounters:  11/10/20 164 lb (74.4 kg)  05/13/20 162 lb 14.4 oz (73.9 kg)  10/25/19 156 lb 9.6 oz (71 kg)   Physical Exam Constitutional:      Appearance: Normal appearance.  HENT:     Head: Normocephalic and atraumatic.     Mouth/Throat:     Mouth: Mucous membranes are moist.  Eyes:     Extraocular Movements: Extraocular movements intact.     Pupils: Pupils are equal, round, and reactive to light.  Cardiovascular:     Rate and Rhythm: Normal rate and regular rhythm.     Pulses: Normal pulses.     Heart sounds: Normal heart sounds.  Pulmonary:     Effort: Pulmonary effort is normal.     Breath sounds: Normal breath sounds.  Chest:     Abdominal:     General: Bowel sounds are normal.     Palpations: Abdomen is soft.     Tenderness: There is no abdominal tenderness.  Musculoskeletal:        General: No swelling.     Right lower leg: Edema (trace pitting ankle edema) present.     Left lower leg: Edema (trace pitting ankle edema) present.  Lymphadenopathy:     Cervical: No cervical adenopathy.  Skin:    General: Skin is warm and dry.  Neurological:     General: No focal deficit present.     Mental Status: She is alert and oriented to person, place, and time.  Psychiatric:        Mood and Affect: Mood normal.        Behavior: Behavior normal.      LABORATORY DATA:  I have reviewed the labs as listed.     Latest Ref Rng & Units 10/29/2021    8:06 AM 11/05/2020   10:46 AM 05/06/2020   10:59 AM  CBC  WBC 4.0 - 10.5 K/uL 7.6  9.0  8.1   Hemoglobin 12.0 - 15.0 g/dL 11.0  12.6  12.7   Hematocrit 36.0 - 46.0 % 34.8  38.3  39.0   Platelets 150 - 400 K/uL 303  303  282       Latest Ref Rng & Units 10/29/2021    8:06 AM 11/05/2020   10:46 AM 05/06/2020   10:59 AM  CMP  Glucose 70 - 99 mg/dL 145  114  114   BUN 8 - 23 mg/dL _0 Creatinine 0.44 - 1.00 mg/dL 0.62  0.63  0.63   Sodium 135 - 145 mmol/L 138  137  137   Potassium 3.5 - 5.1 mmol/L 3.6  3.7  3.6   Chloride 98 - 111 mmol/L 105  100  99   CO2 22 - 32 mmol/L _1 Calcium 8.9 - 10.3 mg/dL 8.7  9.2  9.6   Total Protein 6.5 - 8.1 g/dL 7.3  7.6  8.2   Total Bilirubin 0.3 - 1.2 mg/dL 0.7  0.7  0.5   Alkaline Phos 38 - 126 U/L 55  59  56   AST 15 - 41 U/L 49  59  38   ALT 0 - 44 U/L 39  49  39     DIAGNOSTIC IMAGING:  I have independently reviewed the scans and discussed with the patient. MM 3D SCREEN BREAST BILATERAL  Result Date: 10/30/2021 CLINICAL DATA:  Screening. EXAM: DIGITAL SCREENING BILATERAL MAMMOGRAM WITH TOMOSYNTHESIS AND CAD TECHNIQUE: Bilateral screening digital craniocaudal and mediolateral oblique mammograms were obtained.  Bilateral screening digital breast tomosynthesis was performed. The images were evaluated with computer-aided detection. COMPARISON:  Previous exam(s). ACR Breast Density Category b: There are scattered areas of fibroglandular density. FINDINGS: There are no findings suspicious for malignancy. IMPRESSION: No mammographic evidence of malignancy. A result letter of this screening mammogram will be mailed directly to the patient. RECOMMENDATION: Screening mammogram in one year. (Code:SM-B-01Y) BI-RADS CATEGORY  1: Negative. Electronically Signed   By: Kristopher Oppenheim M.D.   On: 10/30/2021 09:21     ASSESSMENT & PLAN: 1. Left breast cancer: - Abnormal mammogram on April 2018. - Biopsy on 09/30/2016 of the left breast at 12 o'clock position.  Consistent with moderately differentiated IDC, ER/PR positive, HER-2 negative. - MRI showed 11 o'clock position left breast, 1.6 x 1.4 x 2.4 cm irregularly bordered enhancing mass. - She underwent left lumpectomy and sentinel lymph node biopsy on 10/28/2016. -Pathology showed moderately differentiated IDC, 0.9 cm associated with prior biopsy site.  An incidental well-differentiated IDC, 0.4 cm located about 5 mm posterior to the first lesion.  Low-grade DCIS was seen.  3 sentinel lymph nodes were negative.  Margins were negative. - She received 16 radiation treatments at [facility] and in [place]. - Anastrozole was started in July 2018 - BCI testing was 2.4% risk of late distant recurrence, but patient expresses strong desire to continue anastrozole due to family history of metastatic breast cancer - will continue 7 to 10 years of anastrozole if tolerated - Last mammogram (10/30/2021): BI-RADS Category 1, negative for any mammographic evidence of malignancy   -- Abdominal US was checked in July 2023 due to transaminitis - no signs of metastatic disease, findings consistent with fatty liver disease and benign liver cyst - Most recent labs (10/29/2021):  CBC with mild anemia (Hgb 11.0), persistent mild transaminitis on CMP (AST 49, ALT normal); LDH normal -Today's physical examination did not reveal any changes in either breast.  Left breast lumpectomy site is unchanged.  There is a thickening felt below the scar.  No palpable adenopathy in bilateral axillary.  -- No reported symptoms concerning for recurrent breast cancer  - PLAN: Patient can continue Arimidex - we discussed that largest risk of her Arimidex is worsening bone mass, and she is agreeable with risks and would like to continue Arimidex treatment at this time. -- Repeat labs and mammogram in June/July 2024 with repeat labs. -- RTC in July 2024, after labs   2.  Osteopenia: - Bone density test on 11/17/2016 showed T score of -2.4. - She was started on Fosamax 70 mg weekly by her PMD in [place]. She is tolerating it well.  She has been taking it since 2018. - We repeated her DEXA scan on 11/20/2018 which showed a T score of -2.2. - Most recent DEXA scan (10/28/2020): T score -2.3 - Most recent vitamin D level (10/29/2021): 64.41, normal -  Discussed with patient the need to continue on calcium and vitamin D supplementation.  Encouraged weightbearing exercises to strengthen bones.  - PLAN:  Repeat DEXA scan in 2 years (June 2024).  Continue Fosamax, calcium, and vitamin D supplements. -- Will consider discontinuing Fosamax 1 year after discontinuation of her Arimedex, pending results of DEXA scan in June 2024.  3.  Normocytic anemia  - Noted per labs from 10/29/2021 with Hgb 11.0 and borderline MCV 80.0 -- Patient denies any bright red blood per rectum or melena. - Mild fatigue with energy at baseline.  Lifelong habit of eating ice. - She has never had an EGD.  Colonoscopy performed in May 2022 with rectal polyp (tubular adenoma), with recommendations for repeat colonoscopy in 5 years. - She has never had any blood transfusions.  She does not take iron tablet at home. - PLAN: We will  check iron and nutritional panel today.  Will call with results and recommendations.    PLAN SUMMARY & DISPOSITION: Labs today (SPEP and nutritional panel) - CALL WITH RESULTS  Mammogram, DEXA scan, and labs in 1 year RTC for OV 1 week after mammogram, DEXA scan, and labs  All questions were answered. The patient knows to call the clinic with any problems, questions or concerns.  Medical decision making: Moderate  Time spent on visit: I spent 20 minutes counseling the patient face to face. The total time spent in the appointment was 30 minutes and more than 50% was on counseling.   Harriett Rush, PA-C  11/05/2021 9:42 AM

## 2021-11-05 ENCOUNTER — Inpatient Hospital Stay (HOSPITAL_COMMUNITY): Payer: Medicare Other | Attending: Hematology | Admitting: Physician Assistant

## 2021-11-05 ENCOUNTER — Other Ambulatory Visit (HOSPITAL_COMMUNITY): Payer: Self-pay | Admitting: *Deleted

## 2021-11-05 ENCOUNTER — Telehealth (HOSPITAL_COMMUNITY): Payer: Self-pay | Admitting: Physician Assistant

## 2021-11-05 ENCOUNTER — Encounter (HOSPITAL_COMMUNITY): Payer: Self-pay | Admitting: Physician Assistant

## 2021-11-05 ENCOUNTER — Inpatient Hospital Stay (HOSPITAL_COMMUNITY): Payer: Medicare Other

## 2021-11-05 VITALS — BP 116/57 | HR 94 | Temp 98.2°F | Resp 16 | Wt 170.4 lb

## 2021-11-05 DIAGNOSIS — G479 Sleep disorder, unspecified: Secondary | ICD-10-CM | POA: Insufficient documentation

## 2021-11-05 DIAGNOSIS — R61 Generalized hyperhidrosis: Secondary | ICD-10-CM | POA: Insufficient documentation

## 2021-11-05 DIAGNOSIS — D649 Anemia, unspecified: Secondary | ICD-10-CM

## 2021-11-05 DIAGNOSIS — M8580 Other specified disorders of bone density and structure, unspecified site: Secondary | ICD-10-CM

## 2021-11-05 DIAGNOSIS — Z8249 Family history of ischemic heart disease and other diseases of the circulatory system: Secondary | ICD-10-CM | POA: Insufficient documentation

## 2021-11-05 DIAGNOSIS — Z853 Personal history of malignant neoplasm of breast: Secondary | ICD-10-CM | POA: Diagnosis not present

## 2021-11-05 DIAGNOSIS — K219 Gastro-esophageal reflux disease without esophagitis: Secondary | ICD-10-CM | POA: Insufficient documentation

## 2021-11-05 DIAGNOSIS — I10 Essential (primary) hypertension: Secondary | ICD-10-CM | POA: Insufficient documentation

## 2021-11-05 DIAGNOSIS — D509 Iron deficiency anemia, unspecified: Secondary | ICD-10-CM

## 2021-11-05 DIAGNOSIS — R200 Anesthesia of skin: Secondary | ICD-10-CM | POA: Diagnosis not present

## 2021-11-05 DIAGNOSIS — Z1231 Encounter for screening mammogram for malignant neoplasm of breast: Secondary | ICD-10-CM | POA: Insufficient documentation

## 2021-11-05 DIAGNOSIS — Z833 Family history of diabetes mellitus: Secondary | ICD-10-CM | POA: Diagnosis not present

## 2021-11-05 DIAGNOSIS — Z803 Family history of malignant neoplasm of breast: Secondary | ICD-10-CM | POA: Insufficient documentation

## 2021-11-05 DIAGNOSIS — E559 Vitamin D deficiency, unspecified: Secondary | ICD-10-CM | POA: Diagnosis not present

## 2021-11-05 DIAGNOSIS — R5383 Other fatigue: Secondary | ICD-10-CM | POA: Diagnosis not present

## 2021-11-05 DIAGNOSIS — R252 Cramp and spasm: Secondary | ICD-10-CM | POA: Diagnosis not present

## 2021-11-05 DIAGNOSIS — Z79899 Other long term (current) drug therapy: Secondary | ICD-10-CM | POA: Insufficient documentation

## 2021-11-05 DIAGNOSIS — Z79811 Long term (current) use of aromatase inhibitors: Secondary | ICD-10-CM | POA: Diagnosis not present

## 2021-11-05 LAB — FERRITIN: Ferritin: 17 ng/mL (ref 11–307)

## 2021-11-05 LAB — IRON AND TIBC
Iron: 40 ug/dL (ref 28–170)
Saturation Ratios: 7 % — ABNORMAL LOW (ref 10.4–31.8)
TIBC: 563 ug/dL — ABNORMAL HIGH (ref 250–450)
UIBC: 523 ug/dL

## 2021-11-05 LAB — FOLATE: Folate: 30.7 ng/mL (ref >5.9)

## 2021-11-05 LAB — VITAMIN B12: Vitamin B-12: 398 pg/mL (ref 180–914)

## 2021-11-05 MED ORDER — ANASTROZOLE 1 MG PO TABS: ORAL_TABLET | ORAL | 11 refills | Status: DC

## 2021-11-05 MED ORDER — ALENDRONATE SODIUM 70 MG PO TABS: ORAL_TABLET | ORAL | 11 refills | Status: DC

## 2021-11-05 NOTE — Patient Instructions (Signed)
[place] at [facility] Discharge Instructions  You were seen today by Tarri Abernethy PA-C for your history of breast cancer.  You most recent mammogram and labs were normal.    You can continue to take anastrozole, as some recent studies show that additional treatment may be beneficial for breast cancer in general, although studies have not yet been completed that would apply specifically to your type of breast cancer.  You should also continue to take Fosamax, as the biggest risk of continuing anastrozole would be worsening of your osteopenia/osteoporosis.  Your next bone density scan will be in June 2024  You are mildly anemic, so we will check some additional labs today to see if you have any vitamin or nutrient deficiencies.  We will CALL VIA TELEPHONE with these results and further instructions.   LABS: Return in 1 year for labs  OTHER TESTS: Repeat mammogram and bone density scan in June 2024  MEDICATIONS: Continue anastrozole and Fosamax  FOLLOW-UP APPOINTMENT: Office visit in 1 year, after mammogram   - - - - - - - - - - - - - - - - - -   Thank you for choosing [place] at [facility] to provide your oncology and hematology care.  To afford each patient quality time with our provider, please arrive at least 15 minutes before your scheduled appointment time.   If you have a lab appointment with the [facility] please come in thru the Main Entrance and check in at the main information desk.  You need to re-schedule your appointment should you arrive 10 or more minutes late.  We strive to give you quality time with our providers, and arriving late affects you and other patients whose appointments are after yours.  Also, if you no show three or more times for appointments you may be dismissed from the clinic at the providers discretion.     Again, thank you for choosing [facility].  Our hope is that these requests  will decrease the amount of time that you wait before being seen by our physicians.       _____________________________________________________________  Should you have questions after your visit to [facility], please contact our office at [phone] and follow the prompts.  Our office hours are 8:00 a.m. and 4:30 p.m. Monday - Friday.  Please note that voicemails left after 4:00 p.m. may not be returned until the following business day.  We are closed weekends and major holidays.  You do have access to a nurse 24-7, just call the main number to the clinic [phone] and do not press any options, hold on the line and a nurse will answer the phone.    For prescription refill requests, have your pharmacy contact our office and allow 72 hours.    Due to Covid, you will need to wear a mask upon entering the hospital. If you do not have a mask, a mask will be given to you at the Main Entrance upon arrival. For doctor visits, patients may have 1 support person age 44 or older with them. For treatment visits, patients can not have anyone with them due to social distancing guidelines and our immunocompromised population.

## 2021-11-05 NOTE — Progress Notes (Signed)
Patient is taking Anastrozole as prescribed.  She has not missed any doses and reports no side effects at this time.   

## 2021-11-05 NOTE — Telephone Encounter (Signed)
I have reviewed results of labs obtained today after noting new onset normocytic anemia. Labs confirm iron deficiency with ferritin 17, iron saturation 7%, and TIBC 563. Normal B12 and folate. Methylmalonic acid and copper pending.  SPEP pending.  Will follow.  PLAN: Discussed with patient via telephone that I would like her to start taking iron tablet once daily.  Detailed instructions also sent via MyChart message.  We will repeat CBC and iron panel in 3 months followed by phone visit to make sure patient is responding appropriately.  I have also ordered stool cards x3, which patient can pick up from fourth floor front desk and return upon completion.  Tarri Abernethy PA-C 11/05/2021 1:21 PM

## 2021-11-07 LAB — METHYLMALONIC ACID, SERUM: Methylmalonic Acid, Quantitative: 173 nmol/L (ref 0–378)

## 2021-11-09 LAB — PROTEIN ELECTROPHORESIS, SERUM
A/G Ratio: 1 (ref 0.7–1.7)
Albumin ELP: 3.7 g/dL (ref 2.9–4.4)
Alpha-1-Globulin: 0.3 g/dL (ref 0.0–0.4)
Alpha-2-Globulin: 0.8 g/dL (ref 0.4–1.0)
Beta Globulin: 1.3 g/dL (ref 0.7–1.3)
Gamma Globulin: 1.2 g/dL (ref 0.4–1.8)
Globulin, Total: 3.6 g/dL (ref 2.2–3.9)
Total Protein ELP: 7.3 g/dL (ref 6.0–8.5)

## 2021-11-09 LAB — COPPER, SERUM: Copper: 117 ug/dL (ref 80–158)

## 2021-11-23 ENCOUNTER — Other Ambulatory Visit (HOSPITAL_COMMUNITY): Payer: Self-pay

## 2021-11-23 DIAGNOSIS — D649 Anemia, unspecified: Secondary | ICD-10-CM | POA: Diagnosis not present

## 2021-11-23 DIAGNOSIS — R61 Generalized hyperhidrosis: Secondary | ICD-10-CM | POA: Diagnosis not present

## 2021-11-23 DIAGNOSIS — R252 Cramp and spasm: Secondary | ICD-10-CM | POA: Diagnosis not present

## 2021-11-23 DIAGNOSIS — D509 Iron deficiency anemia, unspecified: Secondary | ICD-10-CM

## 2021-11-23 DIAGNOSIS — E559 Vitamin D deficiency, unspecified: Secondary | ICD-10-CM | POA: Diagnosis not present

## 2021-11-23 DIAGNOSIS — Z1231 Encounter for screening mammogram for malignant neoplasm of breast: Secondary | ICD-10-CM | POA: Diagnosis not present

## 2021-11-23 DIAGNOSIS — Z853 Personal history of malignant neoplasm of breast: Secondary | ICD-10-CM | POA: Diagnosis not present

## 2021-11-23 LAB — OCCULT BLOOD X 1 CARD TO LAB, STOOL
Fecal Occult Bld: NEGATIVE
Fecal Occult Bld: NEGATIVE
Fecal Occult Bld: POSITIVE — AB

## 2021-11-23 NOTE — Progress Notes (Signed)
Patient called.  Left message for patient to call back.

## 2021-11-24 NOTE — Progress Notes (Signed)
Patient called.  Patient aware and is not inclined to see a gastroenterologist at this time, despite detailed explanation regarding rationale, to include her IDA.  She will call back if she decides otherwise.

## 2021-12-09 DIAGNOSIS — K219 Gastro-esophageal reflux disease without esophagitis: Secondary | ICD-10-CM | POA: Diagnosis not present

## 2021-12-09 DIAGNOSIS — I10 Essential (primary) hypertension: Secondary | ICD-10-CM | POA: Diagnosis not present

## 2021-12-09 DIAGNOSIS — E1169 Type 2 diabetes mellitus with other specified complication: Secondary | ICD-10-CM | POA: Diagnosis not present

## 2021-12-09 DIAGNOSIS — E039 Hypothyroidism, unspecified: Secondary | ICD-10-CM | POA: Diagnosis not present

## 2021-12-09 DIAGNOSIS — E7849 Other hyperlipidemia: Secondary | ICD-10-CM | POA: Diagnosis not present

## 2021-12-14 DIAGNOSIS — I5030 Unspecified diastolic (congestive) heart failure: Secondary | ICD-10-CM | POA: Diagnosis not present

## 2021-12-14 DIAGNOSIS — C50911 Malignant neoplasm of unspecified site of right female breast: Secondary | ICD-10-CM | POA: Diagnosis not present

## 2021-12-14 DIAGNOSIS — E1169 Type 2 diabetes mellitus with other specified complication: Secondary | ICD-10-CM | POA: Diagnosis not present

## 2021-12-14 DIAGNOSIS — Z6831 Body mass index (BMI) 31.0-31.9, adult: Secondary | ICD-10-CM | POA: Diagnosis not present

## 2021-12-14 DIAGNOSIS — E7849 Other hyperlipidemia: Secondary | ICD-10-CM | POA: Diagnosis not present

## 2021-12-14 DIAGNOSIS — D126 Benign neoplasm of colon, unspecified: Secondary | ICD-10-CM | POA: Diagnosis not present

## 2021-12-14 DIAGNOSIS — I10 Essential (primary) hypertension: Secondary | ICD-10-CM | POA: Diagnosis not present

## 2021-12-14 DIAGNOSIS — M8580 Other specified disorders of bone density and structure, unspecified site: Secondary | ICD-10-CM | POA: Diagnosis not present

## 2021-12-14 DIAGNOSIS — D649 Anemia, unspecified: Secondary | ICD-10-CM | POA: Diagnosis not present

## 2022-02-05 ENCOUNTER — Inpatient Hospital Stay: Payer: Medicare Other | Attending: Hematology

## 2022-02-05 DIAGNOSIS — M8580 Other specified disorders of bone density and structure, unspecified site: Secondary | ICD-10-CM | POA: Diagnosis not present

## 2022-02-05 DIAGNOSIS — Z853 Personal history of malignant neoplasm of breast: Secondary | ICD-10-CM | POA: Diagnosis not present

## 2022-02-05 DIAGNOSIS — D509 Iron deficiency anemia, unspecified: Secondary | ICD-10-CM

## 2022-02-05 LAB — CBC WITH DIFFERENTIAL/PLATELET
Abs Immature Granulocytes: 0.04 10*3/uL (ref 0.00–0.07)
Basophils Absolute: 0.1 10*3/uL (ref 0.0–0.1)
Basophils Relative: 1 %
Eosinophils Absolute: 0.1 10*3/uL (ref 0.0–0.5)
Eosinophils Relative: 2 %
HCT: 38.9 % (ref 36.0–46.0)
Hemoglobin: 12.5 g/dL (ref 12.0–15.0)
Immature Granulocytes: 1 %
Lymphocytes Relative: 24 %
Lymphs Abs: 1.9 10*3/uL (ref 0.7–4.0)
MCH: 26.8 pg (ref 26.0–34.0)
MCHC: 32.1 g/dL (ref 30.0–36.0)
MCV: 83.3 fL (ref 80.0–100.0)
Monocytes Absolute: 0.7 10*3/uL (ref 0.1–1.0)
Monocytes Relative: 9 %
Neutro Abs: 5 10*3/uL (ref 1.7–7.7)
Neutrophils Relative %: 63 %
Platelets: 305 10*3/uL (ref 150–400)
RBC: 4.67 MIL/uL (ref 3.87–5.11)
RDW: 18.2 % — ABNORMAL HIGH (ref 11.5–15.5)
WBC: 7.8 10*3/uL (ref 4.0–10.5)
nRBC: 0 % (ref 0.0–0.2)

## 2022-02-05 LAB — IRON AND TIBC
Iron: 57 ug/dL (ref 28–170)
Saturation Ratios: 12 % (ref 10.4–31.8)
TIBC: 486 ug/dL — ABNORMAL HIGH (ref 250–450)
UIBC: 429 ug/dL

## 2022-02-05 LAB — FERRITIN: Ferritin: 15 ng/mL (ref 11–307)

## 2022-02-09 DIAGNOSIS — Z23 Encounter for immunization: Secondary | ICD-10-CM | POA: Diagnosis not present

## 2022-02-10 NOTE — Progress Notes (Signed)
Virtual Visit via Telephone Note [facility]  I connected with Laura Oliver  on 02/11/2022 at 2:58 PM by telephone and verified that I am speaking with the correct person using two identifiers.  Location: Patient: Home Provider: [facility]   I discussed the limitations, risks, security and privacy concerns of performing an evaluation and management service by telephone and the availability of in person appointments. I also discussed with the patient that there may be a patient responsible charge related to this service. The patient expressed understanding and agreed to proceed.  REASON FOR VISIT: Follow-up for iron deficiency anemia  CURRENT THERAPY: Iron tablets  INTERVAL HISTORY: [name] is contacted today for follow-up of her iron deficiency anemia.  She follows in our clinic for her history of left breast cancer and osteopenia, with most recent visit on 11/05/2021.  At that time, she was noted to have iron deficiency anemia.  She has been taking iron tablet (ferrous sulfate) daily for the past 3 months.  She is tolerating her iron tablet well.  Patient denies any bright red blood per rectum or melena.  She reports that her energy is at baseline, with some mild chronic fatigue.  She reports lifelong habit of eating ice but denies any new or abnormal cravings.  She has leg cramps.  No headaches, lightheadedness, or syncope.  She has never had an EGD.  Colonoscopy performed in May 2022 with rectal polyp (tubular adenoma), with recommendations for repeat colonoscopy in 5 years.  She has never had any blood transfusions.  She does not take iron tablet at home.   She reports 80% energy and 100% appetite.  She is maintaining a stable weight at this time.    OBSERVATIONS/OBJECTIVE: Review of Systems  Constitutional:  Negative for chills, diaphoresis, fever, malaise/fatigue and weight loss.  Respiratory:  Negative for cough and shortness of breath.    Cardiovascular:  Negative for chest pain and palpitations.  Gastrointestinal:  Negative for abdominal pain, blood in stool, melena, nausea and vomiting.  Genitourinary:  Positive for frequency (at night).  Neurological:  Negative for dizziness and headaches.     PHYSICAL EXAM (per limitations of virtual telephone visit): The patient is alert and oriented x 3, exhibiting adequate mentation, good mood, and ability to speak in full sentences and execute sound judgement.   ASSESSMENT & PLAN: 1.  Iron deficiency anemia - Noted per labs from 10/29/2021 with Hgb 11.0 and borderline MCV 80.0 -Labs from 11/05/2021 show iron deficiency with ferritin 17, iron saturation 7%, TIBC 563.  B12 and folate normal.  SPEP negative. - Hemoccult stool positive - 1 out of 3 samples (July 2023) - She has never had an EGD.  Colonoscopy performed in May 2022 with rectal polyp (tubular adenoma), with recommendations for repeat colonoscopy in 5 years. - She has never had any blood transfusions. - She has been taking iron tablet daily since July 2023 - Most recent labs (02/05/2022): Hgb 12.5/MCV 83.3, ferritin 15, iron saturation 12% with TIBC 486 -- Patient denies any bright red blood per rectum or melena. - Mild fatigue with energy at baseline.  Lifelong habit of eating ice. - She takes Prilosec at home, which could be interfering with iron absorption.   - PLAN: Hemoglobin returned to normal.  Persistent iron deficiency, but slightly improved. - Recommend continuing iron tablet daily, but taken at a different time of day than her Prilosec (omeprazole). - Repeat labs with phone visit  in 6 months.   - Recommended GI referral due to Hemoccult positive stool, which patient declined a this time but said that she would "think about it."  2.  Osteopenia - Addressed during visit on 11/05/2021 - PLAN:  Repeat DEXA scan in 2 years (June 2024).  Continue Fosamax, calcium, and vitamin D supplements. -- Will consider discontinuing  Fosamax 1 year after discontinuation of her Arimedex, pending results of DEXA scan in June 2024.  3.  History of left-sided breast cancer - Addressed during visit on 11/05/2021 - PLAN: Patient can continue Arimidex - we discussed that largest risk of her Arimidex is worsening bone mass, and she is agreeable with risks and would like to continue Arimidex treatment at this time. -- Repeat labs and mammogram in June/July 2024 with repeat labs. -- RTC in July 2024, after labs    I discussed the assessment and treatment plan with the patient. The patient was provided an opportunity to ask questions and all were answered. The patient agreed with the plan and demonstrated an understanding of the instructions.   The patient was advised to call back or seek an in-person evaluation if the symptoms worsen or if the condition fails to improve as anticipated.  I provided 15 minutes of non-face-to-face time during this encounter.   Harriett Rush, PA-C 02/11/22 5:46 PM

## 2022-02-11 ENCOUNTER — Inpatient Hospital Stay (HOSPITAL_BASED_OUTPATIENT_CLINIC_OR_DEPARTMENT_OTHER): Payer: Medicare Other | Admitting: Physician Assistant

## 2022-02-11 DIAGNOSIS — D509 Iron deficiency anemia, unspecified: Secondary | ICD-10-CM | POA: Diagnosis not present

## 2022-02-11 DIAGNOSIS — Z23 Encounter for immunization: Secondary | ICD-10-CM | POA: Diagnosis not present

## 2022-06-14 DIAGNOSIS — E1169 Type 2 diabetes mellitus with other specified complication: Secondary | ICD-10-CM | POA: Diagnosis not present

## 2022-06-14 DIAGNOSIS — E7801 Familial hypercholesterolemia: Secondary | ICD-10-CM | POA: Diagnosis not present

## 2022-06-14 DIAGNOSIS — E7849 Other hyperlipidemia: Secondary | ICD-10-CM | POA: Diagnosis not present

## 2022-06-14 DIAGNOSIS — I10 Essential (primary) hypertension: Secondary | ICD-10-CM | POA: Diagnosis not present

## 2022-06-14 DIAGNOSIS — K219 Gastro-esophageal reflux disease without esophagitis: Secondary | ICD-10-CM | POA: Diagnosis not present

## 2022-06-18 DIAGNOSIS — Z1331 Encounter for screening for depression: Secondary | ICD-10-CM | POA: Diagnosis not present

## 2022-06-18 DIAGNOSIS — Z6832 Body mass index (BMI) 32.0-32.9, adult: Secondary | ICD-10-CM | POA: Diagnosis not present

## 2022-06-18 DIAGNOSIS — E1169 Type 2 diabetes mellitus with other specified complication: Secondary | ICD-10-CM | POA: Diagnosis not present

## 2022-06-18 DIAGNOSIS — D126 Benign neoplasm of colon, unspecified: Secondary | ICD-10-CM | POA: Diagnosis not present

## 2022-06-18 DIAGNOSIS — Z0001 Encounter for general adult medical examination with abnormal findings: Secondary | ICD-10-CM | POA: Diagnosis not present

## 2022-06-18 DIAGNOSIS — I5030 Unspecified diastolic (congestive) heart failure: Secondary | ICD-10-CM | POA: Diagnosis not present

## 2022-06-18 DIAGNOSIS — I10 Essential (primary) hypertension: Secondary | ICD-10-CM | POA: Diagnosis not present

## 2022-06-18 DIAGNOSIS — D509 Iron deficiency anemia, unspecified: Secondary | ICD-10-CM | POA: Diagnosis not present

## 2022-06-18 DIAGNOSIS — C50911 Malignant neoplasm of unspecified site of right female breast: Secondary | ICD-10-CM | POA: Diagnosis not present

## 2022-06-18 DIAGNOSIS — Z800 Family history of malignant neoplasm of digestive organs: Secondary | ICD-10-CM | POA: Diagnosis not present

## 2022-06-18 DIAGNOSIS — Z1389 Encounter for screening for other disorder: Secondary | ICD-10-CM | POA: Diagnosis not present

## 2022-06-18 DIAGNOSIS — E7849 Other hyperlipidemia: Secondary | ICD-10-CM | POA: Diagnosis not present

## 2022-06-18 DIAGNOSIS — M8580 Other specified disorders of bone density and structure, unspecified site: Secondary | ICD-10-CM | POA: Diagnosis not present

## 2022-07-06 DIAGNOSIS — E119 Type 2 diabetes mellitus without complications: Secondary | ICD-10-CM | POA: Diagnosis not present

## 2022-07-06 DIAGNOSIS — H2513 Age-related nuclear cataract, bilateral: Secondary | ICD-10-CM | POA: Diagnosis not present

## 2022-07-06 DIAGNOSIS — H353131 Nonexudative age-related macular degeneration, bilateral, early dry stage: Secondary | ICD-10-CM | POA: Diagnosis not present

## 2022-07-06 DIAGNOSIS — Z7984 Long term (current) use of oral hypoglycemic drugs: Secondary | ICD-10-CM | POA: Diagnosis not present

## 2022-08-12 ENCOUNTER — Inpatient Hospital Stay: Payer: Medicare Other | Attending: Hematology

## 2022-08-12 DIAGNOSIS — R252 Cramp and spasm: Secondary | ICD-10-CM | POA: Diagnosis not present

## 2022-08-12 DIAGNOSIS — R5383 Other fatigue: Secondary | ICD-10-CM | POA: Diagnosis not present

## 2022-08-12 DIAGNOSIS — D509 Iron deficiency anemia, unspecified: Secondary | ICD-10-CM | POA: Insufficient documentation

## 2022-08-12 DIAGNOSIS — Z79899 Other long term (current) drug therapy: Secondary | ICD-10-CM | POA: Insufficient documentation

## 2022-08-12 DIAGNOSIS — Z853 Personal history of malignant neoplasm of breast: Secondary | ICD-10-CM | POA: Diagnosis not present

## 2022-08-12 DIAGNOSIS — M8580 Other specified disorders of bone density and structure, unspecified site: Secondary | ICD-10-CM | POA: Insufficient documentation

## 2022-08-12 DIAGNOSIS — M7989 Other specified soft tissue disorders: Secondary | ICD-10-CM | POA: Diagnosis not present

## 2022-08-12 LAB — CBC WITH DIFFERENTIAL/PLATELET
Abs Immature Granulocytes: 0.04 10*3/uL (ref 0.00–0.07)
Basophils Absolute: 0.1 10*3/uL (ref 0.0–0.1)
Basophils Relative: 1 %
Eosinophils Absolute: 0.2 10*3/uL (ref 0.0–0.5)
Eosinophils Relative: 2 %
HCT: 42 % (ref 36.0–46.0)
Hemoglobin: 13.8 g/dL (ref 12.0–15.0)
Immature Granulocytes: 0 %
Lymphocytes Relative: 28 %
Lymphs Abs: 2.8 10*3/uL (ref 0.7–4.0)
MCH: 29.6 pg (ref 26.0–34.0)
MCHC: 32.9 g/dL (ref 30.0–36.0)
MCV: 90.1 fL (ref 80.0–100.0)
Monocytes Absolute: 0.7 10*3/uL (ref 0.1–1.0)
Monocytes Relative: 7 %
Neutro Abs: 6.3 10*3/uL (ref 1.7–7.7)
Neutrophils Relative %: 62 %
Platelets: 282 10*3/uL (ref 150–400)
RBC: 4.66 MIL/uL (ref 3.87–5.11)
RDW: 14.3 % (ref 11.5–15.5)
WBC: 10.1 10*3/uL (ref 4.0–10.5)
nRBC: 0 % (ref 0.0–0.2)

## 2022-08-12 LAB — IRON AND TIBC
Iron: 61 ug/dL (ref 28–170)
Saturation Ratios: 15 % (ref 10.4–31.8)
TIBC: 407 ug/dL (ref 250–450)
UIBC: 346 ug/dL

## 2022-08-12 LAB — FERRITIN: Ferritin: 70 ng/mL (ref 11–307)

## 2022-08-18 NOTE — Progress Notes (Signed)
VIRTUAL VISIT via TELEPHONE NOTE [facility]   I connected with Laura Oliver  on 08/19/22 at  1:18 PM  by telephone and verified that I am speaking with the correct person using two identifiers.  Location: Patient: Home Provider: [facility]   I discussed the limitations, risks, security and privacy concerns of performing an evaluation and management service by telephone and the availability of in person appointments. I also discussed with the patient that there may be a patient responsible charge related to this service. The patient expressed understanding and agreed to proceed.  REASON FOR VISIT: Follow-up for iron deficiency anemia   CURRENT THERAPY: Iron tablets  INTERVAL HISTORY:  Laura Oliver is contacted today for follow-up of her iron deficiency anemia.  She follows in our clinic for her history of left breast cancer and osteopenia, with most recent office visit/breast exam on 11/05/2021.  She had a phone visit with Rojelio Brenner PA-C on 02/11/2022.  At today's visit, we will primarily address her anemia.  Overall, she is feeling fairly well.  She has been taking iron tablet (ferrous sulfate) daily and is tolerating her iron tablet well.  Patient denies any bright red blood per rectum or melena.   She reports that her energy is at baseline, with some mild chronic fatigue. Her ice pica has resolved. She has leg cramps, but these have improved.  No headaches, lightheadedness, or syncope.   She reports 50% energy and 100% appetite.  She is maintaining a stable weight at this time.  REVIEW OF SYSTEMS:   Review of Systems  Constitutional:  Positive for malaise/fatigue. Negative for chills, diaphoresis, fever and weight loss.  Respiratory:  Negative for cough and shortness of breath.   Cardiovascular:  Positive for leg swelling. Negative for chest pain and palpitations.  Gastrointestinal:  Negative for abdominal pain, blood in stool, melena,  nausea and vomiting.  Neurological:  Positive for tingling. Negative for dizziness and headaches.     PHYSICAL EXAM: (per limitations of virtual telephone visit)  The patient is alert and oriented x 3, exhibiting adequate mentation, good mood, and ability to speak in full sentences and execute sound judgement.  ASSESSMENT & PLAN:  1.  Iron deficiency anemia - Noted per labs from 10/29/2021 with Hgb 11.0 and borderline MCV 80.0 - Labs from 11/05/2021 show iron deficiency with ferritin 17, iron saturation 7%, TIBC 563.  B12 and folate normal.  SPEP negative. - Hemoccult stool positive - 1 out of 3 samples (July 2023) - She has never had an EGD.  Colonoscopy performed in May 2022 with rectal polyp (tubular adenoma), with recommendations for repeat colonoscopy in 5 years. - She has never had any blood transfusions. - She has been taking iron tablet daily since July 2023 (taken at different time of day than PPI)   - Most recent labs (08/12/2022): Hgb 13.8/MCV 90.1, ferritin 70, iron saturation 15% with TIBC 407 -- Patient denies any bright red blood per rectum or melena. - Mild fatigue with energy at baseline.  Lifelong habit of eating ice resolved after starting iron pills - She takes Prilosec at home, which could have been interfering with iron absorption. - PLAN: Hemoglobin returned to normal.  Iron levels have improved. - Recommend continuing iron tablet daily, but taken at a different time of day than her Prilosec (omeprazole). - Recheck iron panel with labs at next visit for breast cancer. - Recommended GI referral due to Hemoccult positive  stool, which patient declined a this time but said that she would "think about it." Would consider urging this again if she has any future drops in Hgb or iron.   2.  Osteopenia - Addressed during visit on 11/05/2021 - PLAN:  Repeat DEXA scan in 2 years (June 2024).  Continue Fosamax, calcium, and vitamin D supplements. -- Will consider discontinuing Fosamax  1 year after discontinuation of her Arimedex, pending results of DEXA scan in June 2024.   3.  History of left-sided breast cancer - Addressed during visit on 11/05/2021 - PLAN: Patient can continue Arimidex - we discussed that largest risk of her Arimidex is worsening bone mass, and she is agreeable with risks and would like to continue Arimidex treatment at this time. -- Repeat labs and mammogram in June/July 2024 with repeat labs. -- RTC in July 2024, after labs  PLAN SUMMARY: >> Labs 11/05/2022 = CBC, CMP, vitamin D, LDH, iron/TIBC, ferritin >> Bone density scan scheduled for 11/05/2022 >> Mammogram scheduled for 11/05/2022 >> OFFICE visit is scheduled 11/12/2022     I discussed the assessment and treatment plan with the patient. The patient was provided an opportunity to ask questions and all were answered. The patient agreed with the plan and demonstrated an understanding of the instructions.   The patient was advised to call back or seek an in-person evaluation if the symptoms worsen or if the condition fails to improve as anticipated.  I provided 18 minutes of non-face-to-face time during this encounter.   Carnella Guadalajara, PA-C 08/19/22 1:34 PM

## 2022-08-19 ENCOUNTER — Telehealth: Payer: Medicare Other | Admitting: Hematology

## 2022-08-19 ENCOUNTER — Inpatient Hospital Stay (HOSPITAL_BASED_OUTPATIENT_CLINIC_OR_DEPARTMENT_OTHER): Payer: Medicare Other | Admitting: Physician Assistant

## 2022-08-19 ENCOUNTER — Encounter: Payer: Self-pay | Admitting: Physician Assistant

## 2022-08-19 ENCOUNTER — Other Ambulatory Visit: Payer: Self-pay

## 2022-08-19 DIAGNOSIS — M8580 Other specified disorders of bone density and structure, unspecified site: Secondary | ICD-10-CM

## 2022-08-19 DIAGNOSIS — Z853 Personal history of malignant neoplasm of breast: Secondary | ICD-10-CM | POA: Diagnosis not present

## 2022-08-19 DIAGNOSIS — D509 Iron deficiency anemia, unspecified: Secondary | ICD-10-CM

## 2022-08-19 DIAGNOSIS — E559 Vitamin D deficiency, unspecified: Secondary | ICD-10-CM

## 2022-08-19 DIAGNOSIS — D649 Anemia, unspecified: Secondary | ICD-10-CM

## 2022-11-05 ENCOUNTER — Encounter (HOSPITAL_COMMUNITY): Payer: Self-pay

## 2022-11-05 ENCOUNTER — Ambulatory Visit (HOSPITAL_COMMUNITY)
Admission: RE | Admit: 2022-11-05 | Discharge: 2022-11-05 | Disposition: A | Discharge status: Home / Self Care | Payer: Medicare Other | Source: Ambulatory Visit | Attending: Physician Assistant | Admitting: Physician Assistant

## 2022-11-05 ENCOUNTER — Other Ambulatory Visit: Payer: Medicare Other

## 2022-11-05 ENCOUNTER — Inpatient Hospital Stay: Payer: Medicare Other | Attending: Hematology

## 2022-11-05 ENCOUNTER — Ambulatory Visit (HOSPITAL_COMMUNITY): Payer: Medicare Other

## 2022-11-05 DIAGNOSIS — D509 Iron deficiency anemia, unspecified: Secondary | ICD-10-CM | POA: Diagnosis not present

## 2022-11-05 DIAGNOSIS — Z853 Personal history of malignant neoplasm of breast: Secondary | ICD-10-CM

## 2022-11-05 DIAGNOSIS — G479 Sleep disorder, unspecified: Secondary | ICD-10-CM | POA: Insufficient documentation

## 2022-11-05 DIAGNOSIS — R0602 Shortness of breath: Secondary | ICD-10-CM | POA: Diagnosis not present

## 2022-11-05 DIAGNOSIS — Z79811 Long term (current) use of aromatase inhibitors: Secondary | ICD-10-CM | POA: Diagnosis not present

## 2022-11-05 DIAGNOSIS — R61 Generalized hyperhidrosis: Secondary | ICD-10-CM | POA: Insufficient documentation

## 2022-11-05 DIAGNOSIS — Z833 Family history of diabetes mellitus: Secondary | ICD-10-CM | POA: Diagnosis not present

## 2022-11-05 DIAGNOSIS — R200 Anesthesia of skin: Secondary | ICD-10-CM | POA: Insufficient documentation

## 2022-11-05 DIAGNOSIS — M8580 Other specified disorders of bone density and structure, unspecified site: Secondary | ICD-10-CM | POA: Insufficient documentation

## 2022-11-05 DIAGNOSIS — M85852 Other specified disorders of bone density and structure, left thigh: Secondary | ICD-10-CM | POA: Diagnosis not present

## 2022-11-05 DIAGNOSIS — D649 Anemia, unspecified: Secondary | ICD-10-CM

## 2022-11-05 DIAGNOSIS — Z170 Estrogen receptor positive status [ER+]: Secondary | ICD-10-CM | POA: Diagnosis not present

## 2022-11-05 DIAGNOSIS — Z923 Personal history of irradiation: Secondary | ICD-10-CM | POA: Insufficient documentation

## 2022-11-05 DIAGNOSIS — Z803 Family history of malignant neoplasm of breast: Secondary | ICD-10-CM | POA: Insufficient documentation

## 2022-11-05 DIAGNOSIS — Z1231 Encounter for screening mammogram for malignant neoplasm of breast: Secondary | ICD-10-CM

## 2022-11-05 DIAGNOSIS — C50212 Malignant neoplasm of upper-inner quadrant of left female breast: Secondary | ICD-10-CM | POA: Insufficient documentation

## 2022-11-05 DIAGNOSIS — Z79899 Other long term (current) drug therapy: Secondary | ICD-10-CM | POA: Diagnosis not present

## 2022-11-05 DIAGNOSIS — R351 Nocturia: Secondary | ICD-10-CM | POA: Insufficient documentation

## 2022-11-05 DIAGNOSIS — R5383 Other fatigue: Secondary | ICD-10-CM | POA: Diagnosis not present

## 2022-11-05 DIAGNOSIS — Z780 Asymptomatic menopausal state: Secondary | ICD-10-CM | POA: Diagnosis not present

## 2022-11-05 LAB — LACTATE DEHYDROGENASE: LDH: 210 U/L — ABNORMAL HIGH (ref 98–192)

## 2022-11-05 LAB — COMPREHENSIVE METABOLIC PANEL
ALT: 73 U/L — ABNORMAL HIGH (ref 0–44)
AST: 115 U/L — ABNORMAL HIGH (ref 15–41)
Albumin: 4.3 g/dL (ref 3.5–5.0)
Alkaline Phosphatase: 75 U/L (ref 38–126)
Anion gap: 11 (ref 5–15)
BUN: 20 mg/dL (ref 8–23)
CO2: 25 mmol/L (ref 22–32)
Calcium: 9.2 mg/dL (ref 8.9–10.3)
Chloride: 102 mmol/L (ref 98–111)
Creatinine, Ser: 0.68 mg/dL (ref 0.44–1.00)
GFR, Estimated: >60 mL/min (ref >60)
Glucose, Bld: 108 mg/dL — ABNORMAL HIGH (ref 70–99)
Potassium: 4.1 mmol/L (ref 3.5–5.1)
Sodium: 138 mmol/L (ref 135–145)
Total Bilirubin: 0.4 mg/dL (ref 0.3–1.2)
Total Protein: 8.4 g/dL — ABNORMAL HIGH (ref 6.5–8.1)

## 2022-11-05 LAB — CBC WITH DIFFERENTIAL/PLATELET
Abs Immature Granulocytes: 0.08 10*3/uL — ABNORMAL HIGH (ref 0.00–0.07)
Basophils Absolute: 0.1 10*3/uL (ref 0.0–0.1)
Basophils Relative: 1 %
Eosinophils Absolute: 0.2 10*3/uL (ref 0.0–0.5)
Eosinophils Relative: 2 %
HCT: 42.5 % (ref 36.0–46.0)
Hemoglobin: 13.9 g/dL (ref 12.0–15.0)
Immature Granulocytes: 1 %
Lymphocytes Relative: 21 %
Lymphs Abs: 2 10*3/uL (ref 0.7–4.0)
MCH: 29.3 pg (ref 26.0–34.0)
MCHC: 32.7 g/dL (ref 30.0–36.0)
MCV: 89.7 fL (ref 80.0–100.0)
Monocytes Absolute: 0.7 10*3/uL (ref 0.1–1.0)
Monocytes Relative: 7 %
Neutro Abs: 6.9 10*3/uL (ref 1.7–7.7)
Neutrophils Relative %: 68 %
Platelets: 292 10*3/uL (ref 150–400)
RBC: 4.74 MIL/uL (ref 3.87–5.11)
RDW: 14 % (ref 11.5–15.5)
WBC: 9.9 10*3/uL (ref 4.0–10.5)
nRBC: 0 % (ref 0.0–0.2)

## 2022-11-05 LAB — IRON AND TIBC
Iron: 63 ug/dL (ref 28–170)
Saturation Ratios: 15 % (ref 10.4–31.8)
TIBC: 429 ug/dL (ref 250–450)
UIBC: 366 ug/dL

## 2022-11-05 LAB — VITAMIN D 25 HYDROXY (VIT D DEFICIENCY, FRACTURES): Vit D, 25-Hydroxy: 47.29 ng/mL (ref 30–100)

## 2022-11-05 LAB — FERRITIN: Ferritin: 95 ng/mL (ref 11–307)

## 2022-11-07 ENCOUNTER — Other Ambulatory Visit: Payer: Self-pay | Admitting: Physician Assistant

## 2022-11-07 DIAGNOSIS — Z79811 Long term (current) use of aromatase inhibitors: Secondary | ICD-10-CM

## 2022-11-07 DIAGNOSIS — M8580 Other specified disorders of bone density and structure, unspecified site: Secondary | ICD-10-CM

## 2022-11-11 NOTE — Progress Notes (Addendum)
[facility] [address] [place], [place] [address]   CLINIC:  Medical Oncology/Hematology  PCP:  Laura Alcide, MD [address] [place] [address] [phone]   REASON FOR VISIT:  Follow-up for left breast cancer   PRIOR THERAPY: Lumpectomy and sentinel lymph node biopsy (10/28/2016); radiation   CURRENT THERAPY: Anastrozole (started July 2018)  BRIEF ONCOLOGIC HISTORY:   Oncology History  History of left breast cancer  06/28/2018 Initial Diagnosis   History of left breast cancer   11/24/2020 Genetic Testing   BCI Testing Results:       CANCER STAGING: Cancer Staging  No matching staging information was found for the patient.   INTERVAL HISTORY:   [name], a 72 y.o. female, returns for routine follow-up of her history of left-sided breast cancer, osteopenia, and iron deficiency anemia. Laura Oliver was last seen in person on 11/05/2021, with most recent telemedicine evaluation by Laura Brenner PA-C on 08/19/2022.  At today's visit, she  reports feeling well.   She denies any recent hospitalizations, surgeries, or changes in her  baseline health status.  She denies any symptoms of recurrence such as new lumps, bone pain, chest pain, dyspnea, or abdominal pain.  She has no new headaches, seizures, or focal neurologic deficits.   No B symptoms such as fever, chills, unintentional weight loss.  She does not have any lymphedema of her affected side (left side).    She finished her last dose of Arimidex on 10/10/2022.  Night sweats have resolved after she stopped Arimidex. She continues to take Fosamax and denies any recent fractures, bone pain, or jaw pain.  She has been taking iron tablet (ferrous sulfate) daily and is tolerating her iron tablet well.  Patient denies any bright red blood per rectum or melena.   She reports that her energy is at baseline, with some mild chronic fatigue. Her ice pica has resolved. She has leg cramps, but these have  improved.  No headaches, lightheadedness, or syncope.   She reports 75% energy and 100% appetite.  She is maintaining stable weight at this time.   ASSESSMENT & PLAN:  1. Left breast cancer: - Abnormal mammogram on April 2018. - Biopsy on 09/30/2016 of the left breast at 12 o'clock position.  Consistent with moderately differentiated IDC, ER/PR positive, HER-2 negative. - MRI showed 11 o'clock position left breast, 1.6 x 1.4 x 2.4 cm irregularly bordered enhancing mass. - She underwent left lumpectomy and sentinel lymph node biopsy on 10/28/2016. - Pathology showed moderately differentiated IDC, 0.9 cm associated with prior biopsy site.  An incidental well-differentiated IDC, 0.4 cm located about 5 mm posterior to the first lesion.  Low-grade DCIS was seen.  3 sentinel lymph nodes were negative.  Margins were negative. - She received 16 radiation treatments at [facility] and in [place]. - Anastrozole from July 2018 - June 2024 (6 years) BCI testing was 2.4% risk of late distant recurrence, with NO evidence of benefit from prolonged antiestrogen therapy  - FAMILY HISTORY notable for patient's daughter having metastatic breast cancer - Last mammogram (11/05/2022): BI-RADS Category 1, negative for any mammographic evidence of malignancy   -Abdominal US was checked in July 2023 due to transaminitis - no signs of metastatic disease, findings consistent with fatty liver disease and benign liver cyst - Most recent labs (11/05/2022): CBC essentially normal.  CMP shows increased transaminitis with AST 115, ALT 73.  Mildly elevated LDH 210. -Today's  physical examination did not reveal any changes in either breast.  Left breast lumpectomy site is unchanged.  There is a thickening felt below the scar.  No palpable adenopathy in bilateral axillary. -- No reported symptoms concerning for recurrent breast cancer - PLAN: We will refer to gastroenterology due to transaminitis - Recommend  HOLDING anastrozole and checking repeat CMP in 2 months to see if anastrozole is culprit  -- Repeat labs and mammogram in July 2025  -- Office visit in July 2025, after labs   2.  Osteopenia: - Bone density test on 11/17/2016 showed T score of -2.4. - She was started on Fosamax 70 mg weekly in 2018, by her PMD in [place]. She is tolerating it well. - DEXA scan on 11/20/2018 showed a T score of -2.2. - Most recent DEXA scan (11/05/2022): T score -2.2 - Most recent vitamin D level (11/05/2022): 47.29, normal - Discussed with patient the need to continue on calcium and vitamin D supplementation.  Encouraged weightbearing exercises to strengthen bones.  - PLAN: Patient has completed 6 years of Fosamax.  Anastrozole completed as of June 2024, therefore she can discontinue Fosamax. - Repeat DEXA scan in 2 years (July 2026).  3.  Iron deficiency anemia - Noted per labs from 10/29/2021 with Hgb 11.0 and borderline MCV 80.0 - Labs from 11/05/2021 show iron deficiency with ferritin 17, iron saturation 7%, TIBC 563.  B12 and folate normal.  SPEP negative. - Hemoccult stool positive - 1 out of 3 samples (July 2023) - She has never had an EGD.  Colonoscopy performed in May 2022 with rectal polyp (tubular adenoma), with recommendations for repeat colonoscopy in 5 years. - She has never had any blood transfusions. - She has been taking iron tablet daily since July 2023 (taken at different time of day than PPI) - Most recent labs (11/05/2022): Hgb 13.9/MCV 89.7, ferritin 95, iron saturation 15% with TIBC 429 -- Patient denies any bright red blood per rectum or melena. - Mild fatigue with energy at baseline.  Lifelong habit of eating ice resolved after starting iron pills - She takes Prilosec at home, which could have been interfering with iron absorption. - PLAN: Continue daily iron tablet (separated from omeprazole). - Recheck iron panel in 1 year - Recommended GI referral due to Hemoccult positive stool  PLAN  SUMMARY: >> Referral entered for gastroenterology (Dr. Levon Oliver) for "transaminitis, mild IDA, Hemoccult positive stool" >> Mammogram (11/06/2023) >> Labs in July 2025 = CBC/D, CMP, Vitamin D, ferritin, iron/TIBC >> OFFICE visit July 2025 (1 week after labs/mammogram)    REVIEW OF SYSTEMS:   Review of Systems  Constitutional:  Positive for fatigue. Negative for appetite change, chills, diaphoresis, fever and unexpected weight change.  HENT:   Negative for lump/mass and nosebleeds.   Eyes:  Negative for eye problems.  Respiratory:  Positive for shortness of breath (with exertion). Negative for cough and hemoptysis.   Cardiovascular:  Negative for chest pain, leg swelling and palpitations.  Gastrointestinal:  Negative for abdominal pain, blood in stool, constipation, diarrhea, nausea and vomiting.  Genitourinary:  Positive for nocturia. Negative for hematuria.   Skin: Negative.   Neurological:  Positive for numbness (fingertips). Negative for dizziness, headaches and light-headedness.  Hematological:  Does not bruise/bleed easily.  Psychiatric/Behavioral:  Positive for sleep disturbance.     PHYSICAL EXAM:   Performance status (ECOG): 0 - Asymptomatic  There were no vitals filed for this visit. Wt Readings from Last 3 Encounters:  11/05/21 170  lb 6.7 oz (77.3 kg)  11/10/20 164 lb (74.4 kg)  05/13/20 162 lb 14.4 oz (73.9 kg)   Physical Exam Constitutional:      Appearance: Normal appearance.  HENT:     Head: Normocephalic and atraumatic.     Mouth/Throat:     Mouth: Mucous membranes are moist.  Eyes:     Extraocular Movements: Extraocular movements intact.     Pupils: Pupils are equal, round, and reactive to light.  Cardiovascular:     Rate and Rhythm: Normal rate and regular rhythm.     Pulses: Normal pulses.     Heart sounds: Normal heart sounds.  Pulmonary:     Effort: Pulmonary effort is normal.     Breath sounds: Normal breath sounds.  Chest:    Abdominal:      General: Bowel sounds are normal.     Palpations: Abdomen is soft.     Tenderness: There is no abdominal tenderness.  Musculoskeletal:        General: No swelling.     Right lower leg: Edema (trace pitting ankle edema) present.     Left lower leg: Edema (trace pitting ankle edema) present.  Lymphadenopathy:     Cervical: No cervical adenopathy.  Skin:    General: Skin is warm and dry.  Neurological:     General: No focal deficit present.     Mental Status: She is alert and oriented to person, place, and time.  Psychiatric:        Mood and Affect: Mood normal.        Behavior: Behavior normal.      PAST MEDICAL/SURGICAL HISTORY:  Past Medical History:  Diagnosis Date   Breast cancer (HCC) 10/28/2016   left   Diabetes mellitus without complication (HCC)    GERD (gastroesophageal reflux disease)    High cholesterol    Hypertension    Past Surgical History:  Procedure Laterality Date   ANKLE ARTHROPLASTY     BREAST LUMPECTOMY Left 10/28/2016   TUBAL LIGATION      SOCIAL HISTORY:  Social History   Socioeconomic History   Marital status: Married    Spouse name: Not on file   Number of children: Not on file   Years of education: Not on file   Highest education level: Not on file  Occupational History   Occupation: retired  Tobacco Use   Smoking status: Never   Smokeless tobacco: Never  Vaping Use   Vaping status: Never Used  Substance and Sexual Activity   Alcohol use: Yes    Comment: prn beer   Drug use: Never   Sexual activity: Not on file  Other Topics Concern   Not on file  Social History Narrative   From [place]   Recently retired and moved to [place] to be closer to her family.    Social Determinants of Health   Financial Resource Strain: Low Risk  (03/01/2018)   Overall Financial Resource Strain (CARDIA)    Difficulty of Paying Living Expenses: Not hard at all  Food Insecurity: No Food Insecurity (03/01/2018)   Hunger Vital Sign    Worried About  Running Out of Food in the Last Year: Never true    Ran Out of Food in the Last Year: Never true  Transportation Needs: No Transportation Needs (03/01/2018)   PRAPARE - Administrator, Civil Service (Medical): No    Lack of Transportation (Non-Medical): No  Physical Activity: Not on file  Stress:  No Stress Concern Present (03/01/2018)   [employer] of Occupational Health - Occupational Stress Questionnaire    Feeling of Stress : Not at all  Social Connections: Unknown (03/01/2018)   Social Connection and Isolation Panel [NHANES]    Frequency of Communication with Friends and Family: Not asked    Frequency of Social Gatherings with Friends and Family: Not on file    Attends Religious Services: Not on file    Active Member of Clubs or Organizations: Not on file    Attends Banker Meetings: Not on file    Marital Status: Not on file  Intimate Partner Violence: Not on file    FAMILY HISTORY:  Family History  Problem Relation Age of Onset   Cancer Daughter        breast   Rheumatic fever Father    Diabetes Brother    Hypertension Brother     CURRENT MEDICATIONS:  Current Outpatient Medications  Medication Sig Dispense Refill   ACCU-CHEK GUIDE test strip daily. for testing     alendronate (FOSAMAX) 70 MG tablet TAKE 1 TABLET BY MOUTH 1 TIME A WEEK WITH A FULL GLASS OF WATER AND ON AN EMPTY STOMACH 4 tablet 11   amLODipine (NORVASC) 10 MG tablet Take 10 mg by mouth at bedtime.     anastrozole (ARIMIDEX) 1 MG tablet TAKE 1 TABLET(1 MG) BY MOUTH DAILY 30 tablet 11   Calcium Carbonate (CALTRATE 600 PO) Take 1 tablet by mouth.     Cholecalciferol (VITAMIN D3) 2000 units TABS Take 1 tablet by mouth.     ezetimibe-simvastatin (VYTORIN) 10-40 MG tablet Take 1 tablet by mouth daily.     metFORMIN (GLUCOPHAGE) 500 MG tablet Take 1,000 mg by mouth 2 (two) times daily with a meal.     metoprolol tartrate (LOPRESSOR) 25 MG tablet Take 25 mg by mouth 2 (two) times  daily.     Multiple Vitamins-Minerals (WOMENS MULTI VITAMIN & MINERAL PO) Take 1 tablet by mouth.     omeprazole (PRILOSEC) 20 MG capsule Take 20 mg by mouth daily.     valsartan-hydrochlorothiazide (DIOVAN-HCT) 320-25 MG tablet TAKE 1 TABLET BY MOUTH EVERY DAY FOR BLOOD PRESSURE     No current facility-administered medications for this visit.    ALLERGIES:  No Known Allergies  LABORATORY DATA:  I have reviewed the labs as listed.     Latest Ref Rng & Units 11/05/2022   12:10 PM 08/12/2022    2:15 PM 02/05/2022    9:28 AM  CBC  WBC 4.0 - 10.5 K/uL 9.9  10.1  7.8   Hemoglobin 12.0 - 15.0 g/dL 09.8  11.9  14.7   Hematocrit 36.0 - 46.0 % 42.5  42.0  38.9   Platelets 150 - 400 K/uL 292  282  305       Latest Ref Rng & Units 11/05/2022   12:10 PM 10/29/2021    8:06 AM 11/05/2020   10:46 AM  CMP  Glucose 70 - 99 mg/dL 829  562  130   BUN 8 - 23 mg/dL 20  15  15    Creatinine 0.44 - 1.00 mg/dL 8.65  7.84  6.96   Sodium 135 - 145 mmol/L 138  138  137   Potassium 3.5 - 5.1 mmol/L 4.1  3.6  3.7   Chloride 98 - 111 mmol/L 102  105  100   CO2 22 - 32 mmol/L 25  26  26    Calcium 8.9 - 10.3 mg/dL  9.2  8.7  9.2   Total Protein 6.5 - 8.1 g/dL 8.4  7.3  7.6   Total Bilirubin 0.3 - 1.2 mg/dL 0.4  0.7  0.7   Alkaline Phos 38 - 126 U/L 75  55  59   AST 15 - 41 U/L 115  49  59   ALT 0 - 44 U/L 73  39  49     DIAGNOSTIC IMAGING:  I have independently reviewed the scans and discussed with the patient. MM 3D SCREEN BREAST BILATERAL  Result Date: 11/08/2022 CLINICAL DATA:  Screening. EXAM: DIGITAL SCREENING BILATERAL MAMMOGRAM WITH TOMOSYNTHESIS AND CAD TECHNIQUE: Bilateral screening digital craniocaudal and mediolateral oblique mammograms were obtained. Bilateral screening digital breast tomosynthesis was performed. The images were evaluated with computer-aided detection. COMPARISON:  Previous exam(s). ACR Breast Density Category b: There are scattered areas of fibroglandular density. FINDINGS: There  are no findings suspicious for malignancy. IMPRESSION: No mammographic evidence of malignancy. A result letter of this screening mammogram will be mailed directly to the patient. RECOMMENDATION: Screening mammogram in one year. (Code:SM-B-01Y) BI-RADS CATEGORY  1: Negative. Electronically Signed   By: Norva Pavlov M.D.   On: 11/08/2022 14:50   DG Bone Density  Result Date: 11/05/2022 EXAM: DUAL X-RAY ABSORPTIOMETRY (DXA) FOR BONE MINERAL DENSITY IMPRESSION: Your patient Raelea Gosse completed a BMD test on 11/05/2022 using the [employer] DXA System (software version: 14.10) manufactured by [employer]. The following summarizes the results of our evaluation. Technologist: KRK PATIENT BIOGRAPHICAL: Name: Breunna, Nordmann Patient ID:  [id number] Birth Date: May 06, 1950 Height:     61.0 in. Gender:      Female Exam Date:  11/05/2022 Weight:     165.0 lbs. Indications: Secondary Osteoporosis, Post Menopausal, Follow up Osteopenia, Hx Breast Ca, Height Loss, Caucasian Fractures: Treatments: Anastrozole, Calcium, Vitamin D, Fosamax, Multivitamin DENSITOMETRY RESULTS: Site      Region     Measured Date Measured Age WHO Classification Young Adult T-score BMD         %Change vs. Previous Significant Change (*) AP Spine L1-L2 11/05/2021 72.7 Osteopenia -1.8 0.954 g/cm2 6.7% Yes AP Spine L1-L2 10/28/2020 70.6 Osteopenia -2.3 0.894 g/cm2 5.2% - AP Spine L1-L2 11/20/2018 68.7 Osteoporosis -2.6 0.850 g/cm2 - - DualFemur Neck Left 11/05/2022 72.7 Osteopenia -2.2 0.737 g/cm2 -2.4% - DualFemur Neck Left 10/28/2020 70.6 Osteopenia -2.0 0.755 g/cm2 0.0% - DualFemur Neck Left 11/20/2018 68.7 Osteopenia -2.0 0.755 g/cm2 - - DualFemur Total Mean 11/05/2022 72.7 Normal -0.4 0.963 g/cm2 3.8% Yes DualFemur Total Mean 10/28/2020 70.6 Normal -0.6 0.928 g/cm2 0.4% - DualFemur Total Mean 11/20/2018 68.7 Normal -0.7 0.924 g/cm2 - - ASSESSMENT: The BMD measured at Femur Neck Left is 0.737 g/cm2 with a T-score of -2.2. This  patient is considered osteopenic according to World Health Organization [facility]) criteria. The scan quality is good. L 3-4 was excluded due to advanced degenerative changes. Compared with the prior study on 10/28/2020, the BMD of the total mean shows a statistically significant increase. Patient is not a candidate for FRAX assessment due to patient currently being on Fosamax. World Science writer [facility]) criteria for post-menopausal, Caucasian Women: Normal:       T-score at or above -1 SD Osteopenia:   T-score between -1 and -2.5 SD Osteoporosis: T-score at or below -2.5 SD RECOMMENDATIONS: 1. All patients should optimize calcium and vitamin D intake. 2. Consider FDA-approved medical therapies in postmenopausal women and med aged 80 years and older, based on the following: a. A  hip or vertebral (clinical or morphometric) fracture b. T-score< -2.5 at the femoral neck or spine after appropriate evaluation to exclude secondary causes c. Low bone mass (T-score between -1.0 and -2.5 at the femoral neck or spine) and a 10-year probability of a hip fracture > 3% or a 10-year probability of a major osteoporosis-related fracture > 20% based on the [place]-adapted WHO algorithm d. Clinician judgment and/or patient preferences may indicate treatment for people with 10-year fracture probabilities above or below these levels FOLLOW-UP: Patients with diagnosis of osteoporosis or at high risk for fracture should have regular bone mineral density tests. For patients eligible for Medicare, routine testing is allowed once every 2 years. The testing frequency can be increased to one year for patients who have rapidly progressing disease, those who are receiving or discontinuing medical therapy to restore bone mass, or have additional risk factors. I have reviewed this report, and agree with the above findings. [facility] Electronically Signed   By: Frederico Hamman M.D.   On: 11/05/2022 13:26     WRAP UP:  All  questions were answered. The patient knows to call the clinic with any problems, questions or concerns.  Medical decision making: Moderate  Time spent on visit: I spent 20 minutes counseling the patient face to face. The total time spent in the appointment was 30 minutes and more than 50% was on counseling.  Carnella Guadalajara, PA-C  11/12/22 8:52 AM

## 2022-11-12 ENCOUNTER — Encounter: Payer: Self-pay | Admitting: Physician Assistant

## 2022-11-12 ENCOUNTER — Inpatient Hospital Stay (HOSPITAL_BASED_OUTPATIENT_CLINIC_OR_DEPARTMENT_OTHER): Payer: Medicare Other | Admitting: Physician Assistant

## 2022-11-12 VITALS — BP 135/80 | HR 80 | Temp 98.0°F | Resp 18 | Wt 166.9 lb

## 2022-11-12 DIAGNOSIS — R7401 Elevation of levels of liver transaminase levels: Secondary | ICD-10-CM

## 2022-11-12 DIAGNOSIS — Z79811 Long term (current) use of aromatase inhibitors: Secondary | ICD-10-CM | POA: Diagnosis not present

## 2022-11-12 DIAGNOSIS — R61 Generalized hyperhidrosis: Secondary | ICD-10-CM | POA: Diagnosis not present

## 2022-11-12 DIAGNOSIS — M8580 Other specified disorders of bone density and structure, unspecified site: Secondary | ICD-10-CM | POA: Diagnosis not present

## 2022-11-12 DIAGNOSIS — D509 Iron deficiency anemia, unspecified: Secondary | ICD-10-CM | POA: Diagnosis not present

## 2022-11-12 DIAGNOSIS — Z1231 Encounter for screening mammogram for malignant neoplasm of breast: Secondary | ICD-10-CM | POA: Diagnosis not present

## 2022-11-12 DIAGNOSIS — E559 Vitamin D deficiency, unspecified: Secondary | ICD-10-CM | POA: Diagnosis not present

## 2022-11-12 DIAGNOSIS — C50212 Malignant neoplasm of upper-inner quadrant of left female breast: Secondary | ICD-10-CM | POA: Diagnosis not present

## 2022-11-12 DIAGNOSIS — Z853 Personal history of malignant neoplasm of breast: Secondary | ICD-10-CM | POA: Diagnosis not present

## 2022-11-12 DIAGNOSIS — Z170 Estrogen receptor positive status [ER+]: Secondary | ICD-10-CM | POA: Diagnosis not present

## 2022-11-12 NOTE — Patient Instructions (Signed)
[facility] **VISIT SUMMARY & IMPORTANT INSTRUCTIONS **   You were seen today by Rojelio Brenner PA-C for your follow-up visit.    HISTORY OF BREAST CANCER You did not have any evidence of recurrent breast cancer based on your most recent labs, mammogram, or physical exam today. You have COMPLETED your treatment with anastrozole, and do not need to continue taking this any longer. We will check repeat mammogram, labs, and see you for an office visit in 1 year.  OSTEOPENIA Your bone density is low, but stable. Since you have finished your anastrozole, you can also stop taking your Fosamax. We will check another bone density scan in July 2026.  IRON DEFICIENCY ANEMIA Your blood and iron levels look much better! Continue to take iron tablet daily. Please discuss your iron deficiency anemia when you see the GI doctor.  ELEVATED LIVER NUMBERS We will refer you to gastroenterology (Dr. Levon Hedger & Doylene Bode, NP) for evaluation of your elevated liver numbers.  They can also discuss your iron deficiency anemia with you to make sure you are not having any bleeding from your stomach or intestines.  FOLLOW-UP APPOINTMENT: Labs, mammogram, and office visit in 1 year (July 2025)  ** Thank you for trusting me with your healthcare!  I strive to provide all of my patients with quality care at each visit.  If you receive a survey for this visit, I would be so grateful to you for taking the time to provide feedback.  Thank you in advance!  ~ Shaquitta Burbridge                   Dr. Doreatha Massed   &   Rojelio Brenner, PA-C   - - - - - - - - - - - - - - - - - -    Thank you for choosing [facility] at [facility] to provide your oncology and hematology care.  To afford each patient quality time with our provider, please arrive at least 15 minutes before your scheduled appointment time.   If you have a lab appointment with the Cancer  Center please come in thru the Main Entrance and check in at the main information desk.  You need to re-schedule your appointment should you arrive 10 or more minutes late.  We strive to give you quality time with our providers, and arriving late affects you and other patients whose appointments are after yours.  Also, if you no show three or more times for appointments you may be dismissed from the clinic at the providers discretion.     Again, thank you for choosing [facility].  Our hope is that these requests will decrease the amount of time that you wait before being seen by our physicians.       _____________________________________________________________  Should you have questions after your visit to [facility], please contact our office at [phone] and follow the prompts.  Our office hours are 8:00 a.m. and 4:30 p.m. Monday - Friday.  Please note that voicemails left after 4:00 p.m. may not be returned until the following business day.  We are closed weekends and major holidays.  You do have access to a nurse 24-7, just call the main number to the clinic [phone] and do not press any options, hold on the line and a nurse will answer the phone.    For prescription refill requests, have your pharmacy  contact our office and allow 72 hours.

## 2022-11-16 ENCOUNTER — Ambulatory Visit (INDEPENDENT_AMBULATORY_CARE_PROVIDER_SITE_OTHER): Payer: Medicare Other | Admitting: Gastroenterology

## 2022-11-16 ENCOUNTER — Encounter (INDEPENDENT_AMBULATORY_CARE_PROVIDER_SITE_OTHER): Payer: Self-pay | Admitting: Gastroenterology

## 2022-11-16 VITALS — BP 130/74 | HR 90 | Temp 97.5°F | Ht 61.0 in | Wt 165.6 lb

## 2022-11-16 DIAGNOSIS — Z853 Personal history of malignant neoplasm of breast: Secondary | ICD-10-CM

## 2022-11-16 DIAGNOSIS — K760 Fatty (change of) liver, not elsewhere classified: Secondary | ICD-10-CM

## 2022-11-16 DIAGNOSIS — R748 Abnormal levels of other serum enzymes: Secondary | ICD-10-CM | POA: Diagnosis not present

## 2022-11-16 MED ORDER — SUTAB 1479-225-188 MG PO TABS: ORAL_TABLET | ORAL | 0 refills | Status: AC

## 2022-11-16 NOTE — Patient Instructions (Signed)
It was very nice to meet you today, as dicussed with will plan for the following : 1) blood work, liver ultrasound and CT 2) Upper endoscopy and Colonoscopy

## 2022-11-16 NOTE — H&P (View-Only) (Signed)
Laura Oliver , M.D. Gastroenterology & Hepatology [facility] [address] [place], [place] [address] Primary Care Physician: Laura Alcide, MD [address] [place] [address]  Chief Complaint:  Elevated liver enzymes , IDA and positive FOBT   History of Present Illness: Laura Oliver is a 72 y.o. female with hypertension hyperlipidemia diabetes, breast cancer status postlumpectomy and radiation 6 years ago ( no chemo ) who presents for evaluation of Elevated liver enzymes , IDA and positive FOBT   Patient reports that she was diagnosed with breast cancer 6 years ago where she underwent lumpectomy and radiation and never had chemotherapy.  She is doing well. The patient denies having any nausea, vomiting, fever, chills, hematochezia, melena, hematemesis, abdominal distention, abdominal pain, diarrhea, jaundice, pruritus or weight loss.  Patient denies drinking alcohol any new medications, no herbal supplements or teas   Patient reports that her daughter was also diagnosed with breast cancer 6 years ago with her, and her daughter now has stage IV metastatic breast cancer recently diagnosed with metastasis to the liver and bone  Patient has been on simvastatin for 13+ years.  Last KGM:WNUU Last Colonoscopy: Colonoscopy performed in May 2022 with rectal polyp (tubular adenoma), with recommendations for repeat colonoscopy in 5 years.  FHx: neg for any gastrointestinal/liver disease,  Social: neg smoking, alcohol or illicit drug use Surgical: no abdominal surgeries  Past Medical History: Past Medical History:  Diagnosis Date   Breast cancer (HCC) 10/28/2016   left   Diabetes mellitus without complication (HCC)    GERD (gastroesophageal reflux disease)    High cholesterol    Hypertension     Past Surgical History: Past Surgical History:  Procedure Laterality Date   ANKLE ARTHROPLASTY     BREAST LUMPECTOMY Left 10/28/2016    TUBAL LIGATION      Family History: Family History  Problem Relation Age of Onset   Cancer Daughter        breast   Rheumatic fever Father    Diabetes Brother    Hypertension Brother     Social History: Social History   Tobacco Use  Smoking Status Never  Smokeless Tobacco Never   Social History   Substance and Sexual Activity  Alcohol Use Yes   Comment: prn beer   Social History   Substance and Sexual Activity  Drug Use Never    Allergies: No Known Allergies  Medications: Current Outpatient Medications  Medication Sig Dispense Refill   ACCU-CHEK GUIDE test strip daily. for testing     amLODipine (NORVASC) 10 MG tablet Take 10 mg by mouth at bedtime.     Calcium Carbonate (CALTRATE 600 PO) Take 1 tablet by mouth.     Cholecalciferol (VITAMIN D3) 2000 units TABS Take 1 tablet by mouth.     ezetimibe-simvastatin (VYTORIN) 10-40 MG tablet Take 1 tablet by mouth daily.     ferrous sulfate 324 MG TBEC Take 324 mg by mouth at bedtime as needed.     metFORMIN (GLUCOPHAGE) 1000 MG tablet Take 1,000 mg by mouth 2 (two) times daily.     metoprolol tartrate (LOPRESSOR) 25 MG tablet Take 25 mg by mouth 2 (two) times daily.     Multiple Vitamins-Minerals (WOMENS MULTI VITAMIN & MINERAL PO) Take 1 tablet by mouth.     omeprazole (PRILOSEC) 20 MG capsule Take 20 mg by mouth daily.     valsartan-hydrochlorothiazide (DIOVAN-HCT) 320-25 MG tablet TAKE 1 TABLET BY MOUTH EVERY  DAY FOR BLOOD PRESSURE     No current facility-administered medications for this visit.    Review of Systems: GENERAL: negative for malaise, night sweats HEENT: No changes in hearing or vision, no nose bleeds or other nasal problems. NECK: Negative for lumps, goiter, pain and significant neck swelling RESPIRATORY: Negative for cough, wheezing CARDIOVASCULAR: Negative for chest pain, leg swelling, palpitations, orthopnea GI: SEE HPI MUSCULOSKELETAL: Negative for joint pain or swelling, back pain, and  muscle pain. SKIN: Negative for lesions, rash HEMATOLOGY Negative for prolonged bleeding, bruising easily, and swollen nodes. ENDOCRINE: Negative for cold or heat intolerance, polyuria, polydipsia and goiter. NEURO: negative for tremor, gait imbalance, syncope and seizures. The remainder of the review of systems is noncontributory.   Physical Exam: BP 130/74 (BP Location: Right Arm, Patient Position: Sitting, Cuff Size: Large)   Pulse 90   Temp (!) 97.5 F (36.4 C) (Temporal)   Ht 5\' 1"  (1.549 m)   Wt 165 lb 9.6 oz (75.1 kg)   BMI 31.29 kg/m  GENERAL: The patient is AO x3, in no acute distress. HEENT: Head is normocephalic and atraumatic. EOMI are intact. Mouth is well hydrated and without lesions. NECK: Supple. No masses LUNGS: Clear to auscultation. No presence of rhonchi/wheezing/rales. Adequate chest expansion HEART: RRR, normal s1 and s2. ABDOMEN: Soft, nontender, no guarding, no peritoneal signs, and nondistended. BS +. No masses. EXTREMITIES: Without any cyanosis, clubbing, rash, lesions or edema. NEUROLOGIC: AOx3, no focal motor deficit. SKIN: no jaundice, no rashes   Imaging/Labs: as above  I personally reviewed and interpreted the available labs, imaging and endoscopic files.    RUQ sono 2022   IMPRESSION: Hepatic steatosis.  Impression and Plan:   Laura Oliver is a 72 y.o. female with hypertension hyperlipidemia diabetes, breast cancer status postlumpectomy and radiation 6 years ago ( no chemo ) who presents for evaluation of Elevated liver enzymes , IDA and positive FOBT   #Elevated liver enzymes Hepatocellular pattern liver injury   AST: 115 ALT: 73   This could be due to MASLD with risk factor of BMI 31 hypertension and diabetes with ultrasound done 2 years ago demonstrating hepatic steatosis  Although patient has history of breast cancer 6 years ago, and it is imperative to rule out any metastatic disease.  Especially when her daughter was also  diagnosed with same breast cancer with her 6 years ago and now daughter has metastasis to the liver.  Low likely for DILI (drug-induced liver injury) as patient is on statin for past 13 years.  Recently anastrozole was also stopped  Will obtain viral hepatitis profile, right upper quadrant abdominal ultrasound, will obtain CT abdomen as ultrasound is a limited study and this high risk patient  #Iron deficiency anemia   Patient ferritin was as low as 15 in October 2023 but has responded to oral iron supplementation.  She is not a vegan ;  eats chicken twice a week, no blood transfusions and no overt bleeding  As per ACG guideline for iron deficiency anemia, bidirectional endoscopy is recommended.  Patient never had upper endoscopy hence we will proceed with EGD  Even though patient had a colonoscopy in 2022 with 1 diminutive polyp, she had a positive screening test ( positive FOBT card) while patient became iron deficient, hence it is reasonable to purse early colonoscopy as it is indicated.  #MASLD   MASLD with risk factor of BMI 31 hypertension and diabetes with ultrasound done 2 years ago demonstrating hepatic steatosis  Resc:     -  walking at a brisk pace/biking at moderate intesity 2.5-5 hours per week     - use pedometer/step counter to track activity     - goal to lose 5-10% of initial body weight     - avoid suagry drinks and juices, use zero calorie beverages     - increase water intake     - eat a low carb diet with plenty of veggies and fruit     - Get sufficient sleep 7-8 hrs nightly     - maitain active lifestyle     - avoid alcohol     - recommend 2-3 cups Coffee daily     - Counsel on lowering cholesterol by having a diet rich in vegetables          protein (avoid red meats) and good fats(fish, salmon).    All questions were answered.      Laura Lawman, MD Gastroenterology and Hepatology [facility]

## 2022-11-16 NOTE — Progress Notes (Signed)
Vista Lawman , M.D. Gastroenterology & Hepatology [facility] [address] [place], [place] [address] Primary Care Physician: Juliette Alcide, MD [address] [place] [address]  Chief Complaint:  Elevated liver enzymes , IDA and positive FOBT   History of Present Illness: [name] is a 72 y.o. female with hypertension hyperlipidemia diabetes, breast cancer status postlumpectomy and radiation 6 years ago ( no chemo ) who presents for evaluation of Elevated liver enzymes , IDA and positive FOBT   Patient reports that she was diagnosed with breast cancer 6 years ago where she underwent lumpectomy and radiation and never had chemotherapy.  She is doing well. The patient denies having any nausea, vomiting, fever, chills, hematochezia, melena, hematemesis, abdominal distention, abdominal pain, diarrhea, jaundice, pruritus or weight loss.  Patient denies drinking alcohol any new medications, no herbal supplements or teas   Patient reports that her daughter was also diagnosed with breast cancer 6 years ago with her, and her daughter now has stage IV metastatic breast cancer recently diagnosed with metastasis to the liver and bone  Patient has been on simvastatin for 13+ years.  Last KGM:WNUU Last Colonoscopy: Colonoscopy performed in May 2022 with rectal polyp (tubular adenoma), with recommendations for repeat colonoscopy in 5 years.  FHx: neg for any gastrointestinal/liver disease,  Social: neg smoking, alcohol or illicit drug use Surgical: no abdominal surgeries  Past Medical History: Past Medical History:  Diagnosis Date   Breast cancer (HCC) 10/28/2016   left   Diabetes mellitus without complication (HCC)    GERD (gastroesophageal reflux disease)    High cholesterol    Hypertension     Past Surgical History: Past Surgical History:  Procedure Laterality Date   ANKLE ARTHROPLASTY     BREAST LUMPECTOMY Left 10/28/2016    TUBAL LIGATION      Family History: Family History  Problem Relation Age of Onset   Cancer Daughter        breast   Rheumatic fever Father    Diabetes Brother    Hypertension Brother     Social History: Social History   Tobacco Use  Smoking Status Never  Smokeless Tobacco Never   Social History   Substance and Sexual Activity  Alcohol Use Yes   Comment: prn beer   Social History   Substance and Sexual Activity  Drug Use Never    Allergies: No Known Allergies  Medications: Current Outpatient Medications  Medication Sig Dispense Refill   ACCU-CHEK GUIDE test strip daily. for testing     amLODipine (NORVASC) 10 MG tablet Take 10 mg by mouth at bedtime.     Calcium Carbonate (CALTRATE 600 PO) Take 1 tablet by mouth.     Cholecalciferol (VITAMIN D3) 2000 units TABS Take 1 tablet by mouth.     ezetimibe-simvastatin (VYTORIN) 10-40 MG tablet Take 1 tablet by mouth daily.     ferrous sulfate 324 MG TBEC Take 324 mg by mouth at bedtime as needed.     metFORMIN (GLUCOPHAGE) 1000 MG tablet Take 1,000 mg by mouth 2 (two) times daily.     metoprolol tartrate (LOPRESSOR) 25 MG tablet Take 25 mg by mouth 2 (two) times daily.     Multiple Vitamins-Minerals (WOMENS MULTI VITAMIN & MINERAL PO) Take 1 tablet by mouth.     omeprazole (PRILOSEC) 20 MG capsule Take 20 mg by mouth daily.     valsartan-hydrochlorothiazide (DIOVAN-HCT) 320-25 MG tablet TAKE 1 TABLET BY MOUTH EVERY  DAY FOR BLOOD PRESSURE     No current facility-administered medications for this visit.    Review of Systems: GENERAL: negative for malaise, night sweats HEENT: No changes in hearing or vision, no nose bleeds or other nasal problems. NECK: Negative for lumps, goiter, pain and significant neck swelling RESPIRATORY: Negative for cough, wheezing CARDIOVASCULAR: Negative for chest pain, leg swelling, palpitations, orthopnea GI: SEE HPI MUSCULOSKELETAL: Negative for joint pain or swelling, back pain, and  muscle pain. SKIN: Negative for lesions, rash HEMATOLOGY Negative for prolonged bleeding, bruising easily, and swollen nodes. ENDOCRINE: Negative for cold or heat intolerance, polyuria, polydipsia and goiter. NEURO: negative for tremor, gait imbalance, syncope and seizures. The remainder of the review of systems is noncontributory.   Physical Exam: BP 130/74 (BP Location: Right Arm, Patient Position: Sitting, Cuff Size: Large)   Pulse 90   Temp (!) 97.5 F (36.4 C) (Temporal)   Ht 5\' 1"  (1.549 m)   Wt 165 lb 9.6 oz (75.1 kg)   BMI 31.29 kg/m  GENERAL: The patient is AO x3, in no acute distress. HEENT: Head is normocephalic and atraumatic. EOMI are intact. Mouth is well hydrated and without lesions. NECK: Supple. No masses LUNGS: Clear to auscultation. No presence of rhonchi/wheezing/rales. Adequate chest expansion HEART: RRR, normal s1 and s2. ABDOMEN: Soft, nontender, no guarding, no peritoneal signs, and nondistended. BS +. No masses. EXTREMITIES: Without any cyanosis, clubbing, rash, lesions or edema. NEUROLOGIC: AOx3, no focal motor deficit. SKIN: no jaundice, no rashes   Imaging/Labs: as above  I personally reviewed and interpreted the available labs, imaging and endoscopic files.    RUQ sono 2022   IMPRESSION: Hepatic steatosis.  Impression and Plan:   WHISPER KURKA is a 72 y.o. female with hypertension hyperlipidemia diabetes, breast cancer status postlumpectomy and radiation 6 years ago ( no chemo ) who presents for evaluation of Elevated liver enzymes , IDA and positive FOBT   #Elevated liver enzymes Hepatocellular pattern liver injury   AST: 115 ALT: 73   This could be due to MASLD with risk factor of BMI 31 hypertension and diabetes with ultrasound done 2 years ago demonstrating hepatic steatosis  Although patient has history of breast cancer 6 years ago, and it is imperative to rule out any metastatic disease.  Especially when her daughter was also  diagnosed with same breast cancer with her 6 years ago and now daughter has metastasis to the liver.  Low likely for DILI (drug-induced liver injury) as patient is on statin for past 13 years.  Recently anastrozole was also stopped  Will obtain viral hepatitis profile, right upper quadrant abdominal ultrasound, will obtain CT abdomen as ultrasound is a limited study and this high risk patient  #Iron deficiency anemia   Patient ferritin was as low as 15 in October 2023 but has responded to oral iron supplementation.  She is not a vegan ;  eats chicken twice a week, no blood transfusions and no overt bleeding  As per ACG guideline for iron deficiency anemia, bidirectional endoscopy is recommended.  Patient never had upper endoscopy hence we will proceed with EGD  Even though patient had a colonoscopy in 2022 with 1 diminutive polyp, she had a positive screening test ( positive FOBT card) while patient became iron deficient, hence it is reasonable to purse early colonoscopy as it is indicated.  #MASLD   MASLD with risk factor of BMI 31 hypertension and diabetes with ultrasound done 2 years ago demonstrating hepatic steatosis  Resc:     -  walking at a brisk pace/biking at moderate intesity 2.5-5 hours per week     - use pedometer/step counter to track activity     - goal to lose 5-10% of initial body weight     - avoid suagry drinks and juices, use zero calorie beverages     - increase water intake     - eat a low carb diet with plenty of veggies and fruit     - Get sufficient sleep 7-8 hrs nightly     - maitain active lifestyle     - avoid alcohol     - recommend 2-3 cups Coffee daily     - Counsel on lowering cholesterol by having a diet rich in vegetables          protein (avoid red meats) and good fats(fish, salmon).    All questions were answered.      Vista Lawman, MD Gastroenterology and Hepatology [facility]

## 2022-11-17 DIAGNOSIS — R748 Abnormal levels of other serum enzymes: Secondary | ICD-10-CM | POA: Diagnosis not present

## 2022-11-19 LAB — COMPREHENSIVE METABOLIC PANEL
ALT: 84 IU/L — ABNORMAL HIGH (ref 0–32)
AST: 142 IU/L — ABNORMAL HIGH (ref 0–40)
Albumin: 4.7 g/dL (ref 3.8–4.8)
Alkaline Phosphatase: 87 IU/L (ref 44–121)
BUN/Creatinine Ratio: 22 (ref 12–28)
BUN: 17 mg/dL (ref 8–27)
Bilirubin Total: 0.5 mg/dL (ref 0.0–1.2)
CO2: 24 mmol/L (ref 20–29)
Calcium: 10 mg/dL (ref 8.7–10.3)
Chloride: 99 mmol/L (ref 96–106)
Creatinine, Ser: 0.79 mg/dL (ref 0.57–1.00)
Globulin, Total: 3.1 g/dL (ref 1.5–4.5)
Glucose: 102 mg/dL — ABNORMAL HIGH (ref 70–99)
Potassium: 4.6 mmol/L (ref 3.5–5.2)
Sodium: 141 mmol/L (ref 134–144)
Total Protein: 7.8 g/dL (ref 6.0–8.5)
eGFR: 79 mL/min/{1.73_m2} (ref >59)

## 2022-11-19 LAB — HEPATITIS C ANTIBODY: Hep C Virus Ab: NONREACTIVE

## 2022-11-19 LAB — HEPATITIS B CORE ANTIBODY, TOTAL: Hep B Core Total Ab: NEGATIVE

## 2022-11-19 LAB — ANA: ANA Titer 1: NEGATIVE

## 2022-11-19 LAB — HEPATITIS B SURFACE ANTIBODY,QUALITATIVE: Hep B Surface Ab, Qual: NONREACTIVE

## 2022-11-19 LAB — MITOCHONDRIAL ANTIBODIES: Mitochondrial Ab: <20 Units (ref 0.0–20.0)

## 2022-11-19 LAB — HEPATITIS A ANTIBODY, TOTAL: hep A Total Ab: NEGATIVE

## 2022-11-19 LAB — ANTI-SMOOTH MUSCLE ANTIBODY, IGG: Smooth Muscle Ab: 10 Units (ref 0–19)

## 2022-11-19 LAB — HEPATITIS B SURFACE ANTIGEN: Hepatitis B Surface Ag: NEGATIVE

## 2022-11-23 ENCOUNTER — Encounter (INDEPENDENT_AMBULATORY_CARE_PROVIDER_SITE_OTHER): Payer: Self-pay | Admitting: *Deleted

## 2022-11-23 NOTE — Progress Notes (Signed)
Hi Laura Oliver ,  Can you please call the patient and tell the patient the lab work shows:  Liver enzymes continue to remain elevated AST/ALT: 142/84 ALP: 87   Negative ANA,AMA, ASMA, meaning she does NOT have autoimmune hepatitis or autoimmune cholangiopathy  Viral hepatitis profile: Hep A non immune , Hep B non immune and non exposed , Hep C negative  Recommendation :  1)Ensure patient is not drinking alcohol and not using any herbal medications or supplementations  2)Pending Ultrasound and CT Abdomen as ordered   3) Will proceed with Upper endoscopy and Colonoscopy for Iron deficiency anemia   Thanks,  Vista Lawman, MD Gastroenterology and Hepatology [facility]astroenterology

## 2022-11-25 ENCOUNTER — Ambulatory Visit (HOSPITAL_COMMUNITY)
Admission: RE | Admit: 2022-11-25 | Discharge: 2022-11-25 | Disposition: A | Discharge status: Home / Self Care | Payer: Medicare Other | Source: Ambulatory Visit | Attending: Gastroenterology | Admitting: Gastroenterology

## 2022-11-25 DIAGNOSIS — R748 Abnormal levels of other serum enzymes: Secondary | ICD-10-CM | POA: Diagnosis not present

## 2022-11-25 DIAGNOSIS — K7689 Other specified diseases of liver: Secondary | ICD-10-CM | POA: Diagnosis not present

## 2022-11-29 NOTE — Patient Instructions (Signed)
HARB[facility] CONVEY  11/29/2022     @PREFPERIOPPHARMACY @   Your procedure is scheduled on  12/02/2022.   Report to [facility] at  0600 A.M.   Call this number if you have problems the morning of surgery:  [phone]  If you experience any cold or flu symptoms such as cough, fever, chills, shortness of breath, etc. between now and your scheduled surgery, please notify us at the above number.   Remember:  Follow the diet and prep instructions given to you bu the office.      DO NOT take any medications for diabetes the morning of your procedure.      Take these medicines the morning of surgery with A SIP OF WATER                                        metoprolol.     Do not wear jewelry, make-up or nail polish, including gel polish,  artificial nails, or any other type of covering on natural nails (fingers and  toes).  Do not wear lotions, powders, or perfumes, or deodorant.  Do not shave 48 hours prior to surgery.  Men may shave face and neck.  Do not bring valuables to the hospital.  [facility] is not responsible for any belongings or valuables.  Contacts, dentures or bridgework may not be worn into surgery.  Leave your suitcase in the car.  After surgery it may be brought to your room.  For patients admitted to the hospital, discharge time will be determined by your treatment team.  Patients discharged the day of surgery will not be allowed to drive home and must have someone with them for 24 hours.    Special instructions:   DO NOT smoke tobacco or vape for 24 hours before your procedure.  Please read over the following fact sheets that you were given. Anesthesia Post-op Instructions and Care and Recovery After Surgery      Upper Endoscopy, Adult, Care After After the procedure, it is common to have a sore throat. It is also common to have: Mild stomach pain or discomfort. Bloating. Nausea. Follow these instructions at home: The instructions below  may help you care for yourself at home. Your health care provider may give you more instructions. If you have questions, ask your health care provider. If you were given a sedative during the procedure, it can affect you for several hours. Do not drive or operate machinery until your health care provider says that it is safe. If you will be going home right after the procedure, plan to have a responsible adult: Take you home from the hospital or clinic. You will not be allowed to drive. Care for you for the time you are told. Follow instructions from your health care provider about what you may eat and drink. Return to your normal activities as told by your health care provider. Ask your health care provider what activities are safe for you. Take over-the-counter and prescription medicines only as told by your health care provider. Contact a health care provider if you: Have a sore throat that lasts longer than one day. Have trouble swallowing. Have a fever. Get help right away if you: Vomit blood or your vomit looks like coffee grounds. Have bloody, black, or tarry stools. Have a very bad sore throat or you cannot swallow.  Have difficulty breathing or very bad pain in your chest or abdomen. These symptoms may be an emergency. Get help right away. Call 911. Do not wait to see if the symptoms will go away. Do not drive yourself to the hospital. Summary After the procedure, it is common to have a sore throat, mild stomach discomfort, bloating, and nausea. If you were given a sedative during the procedure, it can affect you for several hours. Do not drive until your health care provider says that it is safe. Follow instructions from your health care provider about what you may eat and drink. Return to your normal activities as told by your health care provider. This information is not intended to replace advice given to you by your health care provider. Make sure you discuss any questions you  have with your health care provider. Document Revised: 07/29/2021 Document Reviewed: 07/29/2021 Elsevier Patient Education  2024 [employer] Colonoscopy, Adult, Care After The following information offers guidance on how to care for yourself after your procedure. Your health care provider may also give you more specific instructions. If you have problems or questions, contact your health care provider. What can I expect after the procedure? After the procedure, it is common to have: A small amount of blood in your stool for 24 hours after the procedure. Some gas. Mild cramping or bloating of your abdomen. Follow these instructions at home: Eating and drinking  Drink enough fluid to keep your urine pale yellow. Follow instructions from your health care provider about eating or drinking restrictions. Resume your normal diet as told by your health care provider. Avoid heavy or fried foods that are hard to digest. Activity Rest as told by your health care provider. Avoid sitting for a long time without moving. Get up to take short walks every 1-2 hours. This is important to improve blood flow and breathing. Ask for help if you feel weak or unsteady. Return to your normal activities as told by your health care provider. Ask your health care provider what activities are safe for you. Managing cramping and bloating  Try walking around when you have cramps or feel bloated. If directed, apply heat to your abdomen as told by your health care provider. Use the heat source that your health care provider recommends, such as a moist heat pack or a heating pad. Place a towel between your skin and the heat source. Leave the heat on for 20-30 minutes. Remove the heat if your skin turns bright red. This is especially important if you are unable to feel pain, heat, or cold. You have a greater risk of getting burned. General instructions If you were given a sedative during the procedure, it can affect you  for several hours. Do not drive or operate machinery until your health care provider says that it is safe. For the first 24 hours after the procedure: Do not sign important documents. Do not drink alcohol. Do your regular daily activities at a slower pace than normal. Eat soft foods that are easy to digest. Take over-the-counter and prescription medicines only as told by your health care provider. Keep all follow-up visits. This is important. Contact a health care provider if: You have blood in your stool 2-3 days after the procedure. Get help right away if: You have more than a small spotting of blood in your stool. You have large blood clots in your stool. You have swelling of your abdomen. You have nausea or vomiting. You have a fever. You  have increasing pain in your abdomen that is not relieved with medicine. These symptoms may be an emergency. Get help right away. Call 911. Do not wait to see if the symptoms will go away. Do not drive yourself to the hospital. Summary After the procedure, it is common to have a small amount of blood in your stool. You may also have mild cramping and bloating of your abdomen. If you were given a sedative during the procedure, it can affect you for several hours. Do not drive or operate machinery until your health care provider says that it is safe. Get help right away if you have a lot of blood in your stool, nausea or vomiting, a fever, or increased pain in your abdomen. This information is not intended to replace advice given to you by your health care provider. Make sure you discuss any questions you have with your health care provider. Document Revised: 06/01/2022 Document Reviewed: 12/10/2020 Elsevier Patient Education  2024 [employer]. Monitored Anesthesia Care, Care After The following information offers guidance on how to care for yourself after your procedure. Your health care provider may also give you more specific instructions. If you  have problems or questions, contact your health care provider. What can I expect after the procedure? After the procedure, it is common to have: Tiredness. Little or no memory about what happened during or after the procedure. Impaired judgment when it comes to making decisions. Nausea or vomiting. Some trouble with balance. Follow these instructions at home: For the time period you were told by your health care provider:  Rest. Do not participate in activities where you could fall or become injured. Do not drive or use machinery. Do not drink alcohol. Do not take sleeping pills or medicines that cause drowsiness. Do not make important decisions or sign legal documents. Do not take care of children on your own. Medicines Take over-the-counter and prescription medicines only as told by your health care provider. If you were prescribed antibiotics, take them as told by your health care provider. Do not stop using the antibiotic even if you start to feel better. Eating and drinking Follow instructions from your health care provider about what you may eat and drink. Drink enough fluid to keep your urine pale yellow. If you vomit: Drink clear fluids slowly and in small amounts as you are able. Clear fluids include water, ice chips, low-calorie sports drinks, and fruit juice that has water added to it (diluted fruit juice). Eat light and bland foods in small amounts as you are able. These foods include bananas, applesauce, rice, lean meats, toast, and crackers. General instructions  Have a responsible adult stay with you for the time you are told. It is important to have someone help care for you until you are awake and alert. If you have sleep apnea, surgery and some medicines can increase your risk for breathing problems. Follow instructions from your health care provider about wearing your sleep device: When you are sleeping. This includes during daytime naps. While taking prescription  pain medicines, sleeping medicines, or medicines that make you drowsy. Do not use any products that contain nicotine or tobacco. These products include cigarettes, chewing tobacco, and vaping devices, such as e-cigarettes. If you need help quitting, ask your health care provider. Contact a health care provider if: You feel nauseous or vomit every time you eat or drink. You feel light-headed. You are still sleepy or having trouble with balance after 24 hours. You get a rash.  You have a fever. You have redness or swelling around the IV site. Get help right away if: You have trouble breathing. You have new confusion after you get home. These symptoms may be an emergency. Get help right away. Call 911. Do not wait to see if the symptoms will go away. Do not drive yourself to the hospital. This information is not intended to replace advice given to you by your health care provider. Make sure you discuss any questions you have with your health care provider. Document Revised: 09/14/2021 Document Reviewed: 09/14/2021 Elsevier Patient Education  2024 [employer].

## 2022-11-30 ENCOUNTER — Encounter (HOSPITAL_COMMUNITY)
Admission: RE | Admit: 2022-11-30 | Discharge: 2022-11-30 | Disposition: A | Discharge status: Home / Self Care | Payer: Medicare Other | Source: Ambulatory Visit | Attending: Gastroenterology | Admitting: Gastroenterology

## 2022-11-30 ENCOUNTER — Encounter (HOSPITAL_COMMUNITY): Payer: Self-pay

## 2022-11-30 ENCOUNTER — Other Ambulatory Visit: Payer: Self-pay

## 2022-11-30 VITALS — Ht 61.0 in | Wt 165.6 lb

## 2022-11-30 DIAGNOSIS — I10 Essential (primary) hypertension: Secondary | ICD-10-CM | POA: Diagnosis not present

## 2022-11-30 DIAGNOSIS — Z01810 Encounter for preprocedural cardiovascular examination: Secondary | ICD-10-CM | POA: Insufficient documentation

## 2022-12-02 ENCOUNTER — Ambulatory Visit (HOSPITAL_BASED_OUTPATIENT_CLINIC_OR_DEPARTMENT_OTHER): Payer: Medicare Other | Admitting: Certified Registered"

## 2022-12-02 ENCOUNTER — Encounter (INDEPENDENT_AMBULATORY_CARE_PROVIDER_SITE_OTHER): Payer: Self-pay | Admitting: *Deleted

## 2022-12-02 ENCOUNTER — Encounter (HOSPITAL_COMMUNITY)
Admission: RE | Disposition: A | Discharge status: Home / Self Care | Payer: Self-pay | Source: Home / Self Care | Attending: Gastroenterology

## 2022-12-02 ENCOUNTER — Ambulatory Visit (HOSPITAL_COMMUNITY): Payer: Medicare Other | Admitting: Certified Registered"

## 2022-12-02 ENCOUNTER — Encounter (HOSPITAL_COMMUNITY): Payer: Self-pay

## 2022-12-02 ENCOUNTER — Ambulatory Visit (HOSPITAL_COMMUNITY)
Admission: RE | Admit: 2022-12-02 | Discharge: 2022-12-02 | Disposition: A | Discharge status: Home / Self Care | Payer: Medicare Other | Attending: Gastroenterology | Admitting: Gastroenterology

## 2022-12-02 DIAGNOSIS — D126 Benign neoplasm of colon, unspecified: Secondary | ICD-10-CM | POA: Diagnosis not present

## 2022-12-02 DIAGNOSIS — D123 Benign neoplasm of transverse colon: Secondary | ICD-10-CM | POA: Insufficient documentation

## 2022-12-02 DIAGNOSIS — K2970 Gastritis, unspecified, without bleeding: Secondary | ICD-10-CM | POA: Diagnosis not present

## 2022-12-02 DIAGNOSIS — Z7984 Long term (current) use of oral hypoglycemic drugs: Secondary | ICD-10-CM | POA: Diagnosis not present

## 2022-12-02 DIAGNOSIS — K3189 Other diseases of stomach and duodenum: Secondary | ICD-10-CM | POA: Diagnosis not present

## 2022-12-02 DIAGNOSIS — K2950 Unspecified chronic gastritis without bleeding: Secondary | ICD-10-CM | POA: Diagnosis not present

## 2022-12-02 DIAGNOSIS — D175 Benign lipomatous neoplasm of intra-abdominal organs: Secondary | ICD-10-CM | POA: Diagnosis not present

## 2022-12-02 DIAGNOSIS — K635 Polyp of colon: Secondary | ICD-10-CM | POA: Diagnosis not present

## 2022-12-02 DIAGNOSIS — K449 Diaphragmatic hernia without obstruction or gangrene: Secondary | ICD-10-CM

## 2022-12-02 DIAGNOSIS — I10 Essential (primary) hypertension: Secondary | ICD-10-CM | POA: Diagnosis not present

## 2022-12-02 DIAGNOSIS — E119 Type 2 diabetes mellitus without complications: Secondary | ICD-10-CM | POA: Diagnosis not present

## 2022-12-02 DIAGNOSIS — K648 Other hemorrhoids: Secondary | ICD-10-CM | POA: Insufficient documentation

## 2022-12-02 DIAGNOSIS — R195 Other fecal abnormalities: Secondary | ICD-10-CM | POA: Diagnosis not present

## 2022-12-02 DIAGNOSIS — D122 Benign neoplasm of ascending colon: Secondary | ICD-10-CM | POA: Diagnosis not present

## 2022-12-02 DIAGNOSIS — Z1211 Encounter for screening for malignant neoplasm of colon: Secondary | ICD-10-CM | POA: Insufficient documentation

## 2022-12-02 DIAGNOSIS — D125 Benign neoplasm of sigmoid colon: Secondary | ICD-10-CM | POA: Insufficient documentation

## 2022-12-02 DIAGNOSIS — Z853 Personal history of malignant neoplasm of breast: Secondary | ICD-10-CM | POA: Insufficient documentation

## 2022-12-02 DIAGNOSIS — K319 Disease of stomach and duodenum, unspecified: Secondary | ICD-10-CM | POA: Diagnosis not present

## 2022-12-02 DIAGNOSIS — D509 Iron deficiency anemia, unspecified: Secondary | ICD-10-CM | POA: Diagnosis not present

## 2022-12-02 HISTORY — PX: ESOPHAGOGASTRODUODENOSCOPY (EGD) WITH PROPOFOL: SHX5813

## 2022-12-02 HISTORY — PX: POLYPECTOMY: SHX5525

## 2022-12-02 HISTORY — PX: COLONOSCOPY WITH PROPOFOL: SHX5780

## 2022-12-02 LAB — GLUCOSE, CAPILLARY: Glucose-Capillary: 133 mg/dL — ABNORMAL HIGH (ref 70–99)

## 2022-12-02 LAB — HM COLONOSCOPY

## 2022-12-02 SURGERY — COLONOSCOPY WITH PROPOFOL
Anesthesia: General

## 2022-12-02 MED ORDER — LACTATED RINGERS IV SOLN: INTRAVENOUS | Status: DC

## 2022-12-02 MED ORDER — LIDOCAINE HCL (PF) 2 % IJ SOLN
INTRAMUSCULAR | Status: AC
  Filled 2022-12-02: qty 10

## 2022-12-02 MED ORDER — PROPOFOL 500 MG/50ML IV EMUL
INTRAVENOUS | Status: DC | PRN
  Administered 2022-12-02: 150 ug/kg/min via INTRAVENOUS

## 2022-12-02 MED ORDER — PROPOFOL 1000 MG/100ML IV EMUL
INTRAVENOUS | Status: AC
  Filled 2022-12-02: qty 100

## 2022-12-02 MED ORDER — LIDOCAINE HCL (CARDIAC) PF 100 MG/5ML IV SOSY
PREFILLED_SYRINGE | INTRAVENOUS | Status: DC | PRN
  Administered 2022-12-02: 100 mg via INTRAVENOUS

## 2022-12-02 MED ORDER — PROPOFOL 10 MG/ML IV BOLUS
INTRAVENOUS | Status: DC | PRN
  Administered 2022-12-02: 20 mg via INTRAVENOUS
  Administered 2022-12-02: 30 mg via INTRAVENOUS
  Administered 2022-12-02: 100 mg via INTRAVENOUS

## 2022-12-02 MED ORDER — GLYCOPYRROLATE PF 0.2 MG/ML IJ SOSY
PREFILLED_SYRINGE | INTRAMUSCULAR | Status: DC | PRN
  Administered 2022-12-02 (×2): .1 mg via INTRAVENOUS

## 2022-12-02 MED ORDER — GLYCOPYRROLATE PF 0.2 MG/ML IJ SOSY
PREFILLED_SYRINGE | INTRAMUSCULAR | Status: AC
  Filled 2022-12-02: qty 1

## 2022-12-02 NOTE — Transfer of Care (Addendum)
Immediate Anesthesia Transfer of Care Note  Patient: Laura Oliver  Procedure(s) Performed: COLONOSCOPY WITH PROPOFOL ESOPHAGOGASTRODUODENOSCOPY (EGD) WITH PROPOFOL POLYPECTOMY  Patient Location: Short Stay  Anesthesia Type:General  Level of Consciousness: awake and patient cooperative  Airway & Oxygen Therapy: Patient Spontanous Breathing and Patient connected to nasal cannula oxygen  Post-op Assessment: Report given to RN and Post -op Vital signs reviewed and stable  Post vital signs: Reviewed and stable  Last Vitals:  Vitals Value Taken Time  BP 111/53 12/02/22   0831  Temp 36.4 12/02/22   0831  Pulse 88 12/02/22   0831  Resp 20 12/02/22   0831  SpO2 97% 12/02/22   0831    Last Pain:  Vitals:   12/02/22 0735  PainSc: 0-No pain      Patients Stated Pain Goal: 10 (12/02/22 0735)  Complications: No notable events documented.

## 2022-12-02 NOTE — Op Note (Signed)
[facility] Patient Name: Laura Oliver Procedure Date: 12/02/2022 7:08 AM MRN: [id number] Date of Birth: 1951-04-08 Attending MD: Sanjuan Dame , MD, [id number] CSN: [id number] Age: 72 Admit Type: Outpatient Procedure:                Upper GI endoscopy Indications:              Iron deficiency anemia Providers:                Sanjuan Dame, MD, Nena Polio, RN, Zena Amos Referring MD:              Medicines:                Monitored Anesthesia Care Complications:            No immediate complications. Estimated Blood Loss:     Estimated blood loss: none. Procedure:                Pre-Anesthesia Assessment:                           - Prior to the procedure, a History and Physical                            was performed, and patient medications and                            allergies were reviewed. The patient's tolerance of                            previous anesthesia was also reviewed. The risks                            and benefits of the procedure and the sedation                            options and risks were discussed with the patient.                            All questions were answered, and informed consent                            was obtained. Prior Anticoagulants: The patient has                            taken no anticoagulant or antiplatelet agents. ASA                            Grade Assessment: III - A patient with severe                            systemic disease. After reviewing the risks and                            benefits, the patient was deemed in satisfactory  condition to undergo the procedure.                           After obtaining informed consent, the endoscope was                            passed under direct vision. Throughout the                            procedure, the patient's blood pressure, pulse, and                            oxygen saturations were monitored continuously. The                             [id number]-[id number] ([id number]) scope was introduced through the                            mouth, and advanced to the second part of duodenum.                            The upper GI endoscopy was accomplished without                            difficulty. The patient tolerated the procedure                            well. Scope In: 7:43:25 AM Scope Out: 7:47:59 AM Total Procedure Duration: 0 hours 4 minutes 34 seconds  Findings:      The examined esophagus was normal.      Esophagogastric landmarks were identified: the Z-line was found at 33 cm       and the gastroesophageal junction was found at 35 cm from the incisors.      A 3 cm hiatal hernia was present.      Mildly erythematous mucosa without bleeding was found in the stomach.       Biopsies were taken with a cold forceps for histology.      The duodenal bulb and second portion of the duodenum were normal. Impression:               - Normal esophagus.                           - Esophagogastric landmarks identified.                           - 3 cm hiatal hernia.                           - Erythematous mucosa in the stomach. Biopsied.                           - Normal duodenal bulb and second portion of the                            duodenum.  Moderate Sedation:      Per Anesthesia Care Recommendation:           - Patient has a contact number available for                            emergencies. The signs and symptoms of potential                            delayed complications were discussed with the                            patient. Return to normal activities tomorrow.                            Written discharge instructions were provided to the                            patient.                           - Resume previous diet.                           - Continue present medications.                           - Await pathology results. Procedure Code(s):        --- Professional ---                            [phone], Esophagogastroduodenoscopy, flexible,                            transoral; with biopsy, single or multiple Diagnosis Code(s):        --- Professional ---                           K44.9, Diaphragmatic hernia without obstruction or                            gangrene                           K31.89, Other diseases of stomach and duodenum                           D50.9, Iron deficiency anemia, unspecified CPT copyright 2022 American Medical Association. All rights reserved. The codes documented in this report are preliminary and upon coder review may  be revised to meet current compliance requirements. Sanjuan Dame, MD Sanjuan Dame, MD 12/02/2022 7:52:20 AM This report has been signed electronically. Number of Addenda: 0

## 2022-12-02 NOTE — Op Note (Signed)
[facility] Patient Name: Laura Oliver Procedure Date: 12/02/2022 7:19 AM MRN: [id number] Date of Birth: 1950-08-14 Attending MD: Sanjuan Dame , MD, [id number] CSN: [id number] Age: 72 Admit Type: Outpatient Procedure:                Colonoscopy Indications:              Heme positive stool Providers:                Sanjuan Dame, MD, Nena Polio, RN, Zena Amos Referring MD:              Medicines:                Monitored Anesthesia Care Complications:            No immediate complications. Estimated Blood Loss:     Estimated blood loss: none. Procedure:                Pre-Anesthesia Assessment:                           - Prior to the procedure, a History and Physical                            was performed, and patient medications and                            allergies were reviewed. The patient's tolerance of                            previous anesthesia was also reviewed. The risks                            and benefits of the procedure and the sedation                            options and risks were discussed with the patient.                            All questions were answered, and informed consent                            was obtained. Prior Anticoagulants: The patient has                            taken no anticoagulant or antiplatelet agents. ASA                            Grade Assessment: III - A patient with severe                            systemic disease. After reviewing the risks and                            benefits, the patient was deemed in satisfactory  condition to undergo the procedure.                           After obtaining informed consent, the colonoscope                            was passed under direct vision. Throughout the                            procedure, the patient's blood pressure, pulse, and                            oxygen saturations were monitored continuously. The                             [id number] scope was introduced through                            the anus and advanced to the the cecum, identified                            by appendiceal orifice and ileocecal valve. The                            colonoscopy was performed without difficulty. The                            patient tolerated the procedure well. The quality                            of the bowel preparation was evaluated using the                            BBPS [facility] Bowel Preparation Scale) with scores                            of: Right Colon = 2 (minor amount of residual                            staining, small fragments of stool and/or opaque                            liquid, but mucosa seen well), Transverse Colon = 2                            (minor amount of residual staining, small fragments                            of stool and/or opaque liquid, but mucosa seen                            well) and Left Colon = 2 (minor amount of residual  staining, small fragments of stool and/or opaque                            liquid, but mucosa seen well). The total BBPS score                            equals 6. The ileocecal valve, appendiceal orifice,                            and rectum were photographed. Scope In: 7:54:21 AM Scope Out: 8:26:08 AM Scope Withdrawal Time: 0 hours 27 minutes 15 seconds  Total Procedure Duration: 0 hours 31 minutes 47 seconds  Findings:      The perianal and digital rectal examinations were normal.      There was a medium-sized lipoma, in the cecum.      A 3 mm polyp was found in the ascending colon. The polyp was sessile.       The polyp was removed with a jumbo cold forceps. Resection and retrieval       were complete.      A 3 mm polyp was found in the transverse colon. The polyp was sessile.       The polyp was removed with a jumbo cold forceps. Resection and retrieval       were complete.      A 2 mm polyp  was found in the sigmoid colon. The polyp was sessile. The       polyp was removed with a jumbo cold forceps. Resection and retrieval       were complete.      A 7 mm polyp was found in the sigmoid colon. The polyp was sessile. The       polyp was removed with a cold snare. Resection and retrieval were       complete.      Non-bleeding external internal hemorrhoids were found. Impression:               - Medium-sized lipoma in the cecum.                           - One 3 mm polyp in the ascending colon, removed                            with a jumbo cold forceps. Resected and retrieved.                           - One 3 mm polyp in the transverse colon, removed                            with a jumbo cold forceps. Resected and retrieved.                           - One 2 mm polyp in the sigmoid colon, removed with                            a jumbo cold forceps. Resected and retrieved.                           -  One 7 mm polyp in the sigmoid colon, removed with                            a cold snare. Resected and retrieved.                           - Non-bleeding external internal hemorrhoids. Moderate Sedation:      Per Anesthesia Care Recommendation:           - Repeat colonoscopy for surveillance based on                            pathology results.                           - Return to GI office as previously scheduled. Procedure Code(s):        --- Professional ---                           [phone], Colonoscopy, flexible; with removal of                            tumor(s), polyp(s), or other lesion(s) by snare                            technique                           45380, 59, Colonoscopy, flexible; with biopsy,                            single or multiple Diagnosis Code(s):        --- Professional ---                           D17.5, Benign lipomatous neoplasm of                            intra-abdominal organs                           D12.2, Benign neoplasm of  ascending colon                           D12.3, Benign neoplasm of transverse colon (hepatic                            flexure or splenic flexure)                           D12.5, Benign neoplasm of sigmoid colon                           K64.4, Residual hemorrhoidal skin tags                           K64.8, Other hemorrhoids  R19.5, Other fecal abnormalities CPT copyright 2022 American Medical Association. All rights reserved. The codes documented in this report are preliminary and upon coder review may  be revised to meet current compliance requirements. Sanjuan Dame, MD Sanjuan Dame, MD 12/02/2022 8:32:45 AM This report has been signed electronically. Number of Addenda: 0

## 2022-12-02 NOTE — Discharge Instructions (Signed)

## 2022-12-02 NOTE — Interval H&P Note (Signed)
History and Physical Interval Note:  12/02/2022 7:31 AM  Laura Oliver  has presented today for surgery, with the diagnosis of IDA,.  The various methods of treatment have been discussed with the patient and family. After consideration of risks, benefits and other options for treatment, the patient has consented to  Procedure(s) with comments: COLONOSCOPY WITH PROPOFOL (N/A) - 7:30am;asa 3 ESOPHAGOGASTRODUODENOSCOPY (EGD) WITH PROPOFOL (N/A) - 7:30am;asa 3 as a surgical intervention.  The patient's history has been reviewed, patient examined, no change in status, stable for surgery.  I have reviewed the patient's chart and labs.  Questions were answered to the patient's satisfaction.     [name]

## 2022-12-02 NOTE — Anesthesia Preprocedure Evaluation (Signed)
Anesthesia Evaluation  Patient identified by MRN, date of birth, ID band Patient awake    Reviewed: Allergy & Precautions, H&P , NPO status , Patient's Chart, lab work & pertinent test results, reviewed documented beta blocker date and time   Airway Mallampati: II  TM Distance: >3 FB Neck ROM: full    Dental no notable dental hx.    Pulmonary neg pulmonary ROS   Pulmonary exam normal breath sounds clear to auscultation       Cardiovascular Exercise Tolerance: Good hypertension, negative cardio ROS  Rhythm:regular Rate:Normal     Neuro/Psych negative neurological ROS  negative psych ROS   GI/Hepatic negative GI ROS, Neg liver ROS,GERD  ,,  Endo/Other  negative endocrine ROSdiabetes    Renal/GU negative Renal ROS  negative genitourinary   Musculoskeletal   Abdominal   Peds  Hematology negative hematology ROS (+)   Anesthesia Other Findings   Reproductive/Obstetrics negative OB ROS                             Anesthesia Physical Anesthesia Plan  ASA: 2  Anesthesia Plan: General   Post-op Pain Management:    Induction:   PONV Risk Score and Plan: Propofol infusion  Airway Management Planned:   Additional Equipment:   Intra-op Plan:   Post-operative Plan:   Informed Consent: I have reviewed the patients History and Physical, chart, labs and discussed the procedure including the risks, benefits and alternatives for the proposed anesthesia with the patient or authorized representative who has indicated his/her understanding and acceptance.     Dental Advisory Given  Plan Discussed with: CRNA  Anesthesia Plan Comments:        Anesthesia Quick Evaluation

## 2022-12-03 NOTE — Anesthesia Postprocedure Evaluation (Signed)
Anesthesia Post Note  Patient: Laura Oliver  Procedure(s) Performed: COLONOSCOPY WITH PROPOFOL ESOPHAGOGASTRODUODENOSCOPY (EGD) WITH PROPOFOL POLYPECTOMY  Patient location during evaluation: Phase II Anesthesia Type: General Level of consciousness: awake Pain management: pain level controlled Vital Signs Assessment: post-procedure vital signs reviewed and stable Respiratory status: spontaneous breathing and respiratory function stable Cardiovascular status: blood pressure returned to baseline and stable Postop Assessment: no headache and no apparent nausea or vomiting Anesthetic complications: no Comments: Late entry   No notable events documented.   Last Vitals:  Vitals:   12/02/22 0655 12/02/22 0831  BP: 121/68 (!) 111/53  Pulse: 77 88  Resp: 14 20  Temp: 37.1 C 36.4 C  SpO2: 98% 97%    Last Pain:  Vitals:   12/03/22 1032  TempSrc:   PainSc: 0-No pain                 Windell Norfolk

## 2022-12-07 ENCOUNTER — Encounter (INDEPENDENT_AMBULATORY_CARE_PROVIDER_SITE_OTHER): Payer: Self-pay | Admitting: *Deleted

## 2022-12-08 ENCOUNTER — Encounter (HOSPITAL_COMMUNITY): Payer: Self-pay | Admitting: Gastroenterology

## 2022-12-13 DIAGNOSIS — I10 Essential (primary) hypertension: Secondary | ICD-10-CM | POA: Diagnosis not present

## 2022-12-13 DIAGNOSIS — E7849 Other hyperlipidemia: Secondary | ICD-10-CM | POA: Diagnosis not present

## 2022-12-13 DIAGNOSIS — E7801 Familial hypercholesterolemia: Secondary | ICD-10-CM | POA: Diagnosis not present

## 2022-12-13 DIAGNOSIS — I5030 Unspecified diastolic (congestive) heart failure: Secondary | ICD-10-CM | POA: Diagnosis not present

## 2022-12-13 DIAGNOSIS — E1169 Type 2 diabetes mellitus with other specified complication: Secondary | ICD-10-CM | POA: Diagnosis not present

## 2022-12-13 DIAGNOSIS — K219 Gastro-esophageal reflux disease without esophagitis: Secondary | ICD-10-CM | POA: Diagnosis not present

## 2022-12-13 DIAGNOSIS — Z1329 Encounter for screening for other suspected endocrine disorder: Secondary | ICD-10-CM | POA: Diagnosis not present

## 2022-12-17 DIAGNOSIS — I10 Essential (primary) hypertension: Secondary | ICD-10-CM | POA: Diagnosis not present

## 2022-12-17 DIAGNOSIS — I5030 Unspecified diastolic (congestive) heart failure: Secondary | ICD-10-CM | POA: Diagnosis not present

## 2022-12-17 DIAGNOSIS — C50911 Malignant neoplasm of unspecified site of right female breast: Secondary | ICD-10-CM | POA: Diagnosis not present

## 2022-12-17 DIAGNOSIS — Z6830 Body mass index (BMI) 30.0-30.9, adult: Secondary | ICD-10-CM | POA: Diagnosis not present

## 2022-12-17 DIAGNOSIS — M8580 Other specified disorders of bone density and structure, unspecified site: Secondary | ICD-10-CM | POA: Diagnosis not present

## 2022-12-17 DIAGNOSIS — E7849 Other hyperlipidemia: Secondary | ICD-10-CM | POA: Diagnosis not present

## 2022-12-17 DIAGNOSIS — E1169 Type 2 diabetes mellitus with other specified complication: Secondary | ICD-10-CM | POA: Diagnosis not present

## 2022-12-17 DIAGNOSIS — D126 Benign neoplasm of colon, unspecified: Secondary | ICD-10-CM | POA: Diagnosis not present

## 2022-12-17 DIAGNOSIS — D509 Iron deficiency anemia, unspecified: Secondary | ICD-10-CM | POA: Diagnosis not present

## 2022-12-24 ENCOUNTER — Ambulatory Visit (HOSPITAL_COMMUNITY)
Admission: RE | Admit: 2022-12-24 | Discharge: 2022-12-24 | Disposition: A | Discharge status: Home / Self Care | Payer: Medicare Other | Source: Ambulatory Visit | Attending: Gastroenterology | Admitting: Gastroenterology

## 2022-12-24 DIAGNOSIS — R748 Abnormal levels of other serum enzymes: Secondary | ICD-10-CM | POA: Insufficient documentation

## 2022-12-24 DIAGNOSIS — Z853 Personal history of malignant neoplasm of breast: Secondary | ICD-10-CM | POA: Diagnosis not present

## 2022-12-24 MED ORDER — IOHEXOL 300 MG/ML  SOLN
100.0000 mL | Freq: Once | INTRAMUSCULAR | Status: AC | PRN
  Administered 2022-12-24: 100 mL via INTRAVENOUS

## 2023-01-04 NOTE — Progress Notes (Signed)
Hi Wendy ,  Can you please call the patient and tell the patient the CT shows liver and kidney small cysts for which nothing to be done as they are benign, this is good news as there is no sign of any cancer in the liver  Although there is a nodule in the adrenal gland for which I recommend that the patient follow-up with primary care physician  Could this CT results also be sent to patient's primary care physician?  Thanks,  Vista Lawman, MD Gastroenterology and Hepatology [facility]

## 2023-02-23 DIAGNOSIS — Z23 Encounter for immunization: Secondary | ICD-10-CM | POA: Diagnosis not present

## 2023-06-13 DIAGNOSIS — I10 Essential (primary) hypertension: Secondary | ICD-10-CM | POA: Diagnosis not present

## 2023-06-13 DIAGNOSIS — E1169 Type 2 diabetes mellitus with other specified complication: Secondary | ICD-10-CM | POA: Diagnosis not present

## 2023-06-13 DIAGNOSIS — Z13220 Encounter for screening for lipoid disorders: Secondary | ICD-10-CM | POA: Diagnosis not present

## 2023-06-13 DIAGNOSIS — E119 Type 2 diabetes mellitus without complications: Secondary | ICD-10-CM | POA: Diagnosis not present

## 2023-06-13 DIAGNOSIS — E782 Mixed hyperlipidemia: Secondary | ICD-10-CM | POA: Diagnosis not present

## 2023-06-13 DIAGNOSIS — E7800 Pure hypercholesterolemia, unspecified: Secondary | ICD-10-CM | POA: Diagnosis not present

## 2023-06-20 DIAGNOSIS — Z6831 Body mass index (BMI) 31.0-31.9, adult: Secondary | ICD-10-CM | POA: Diagnosis not present

## 2023-06-20 DIAGNOSIS — I10 Essential (primary) hypertension: Secondary | ICD-10-CM | POA: Diagnosis not present

## 2023-06-20 DIAGNOSIS — E1169 Type 2 diabetes mellitus with other specified complication: Secondary | ICD-10-CM | POA: Diagnosis not present

## 2023-06-20 DIAGNOSIS — D126 Benign neoplasm of colon, unspecified: Secondary | ICD-10-CM | POA: Diagnosis not present

## 2023-06-20 DIAGNOSIS — E7849 Other hyperlipidemia: Secondary | ICD-10-CM | POA: Diagnosis not present

## 2023-06-20 DIAGNOSIS — E782 Mixed hyperlipidemia: Secondary | ICD-10-CM | POA: Diagnosis not present

## 2023-06-20 DIAGNOSIS — Z0001 Encounter for general adult medical examination with abnormal findings: Secondary | ICD-10-CM | POA: Diagnosis not present

## 2023-06-27 DIAGNOSIS — I5189 Other ill-defined heart diseases: Secondary | ICD-10-CM | POA: Diagnosis not present

## 2023-11-07 ENCOUNTER — Other Ambulatory Visit (HOSPITAL_COMMUNITY): Payer: Self-pay | Admitting: Physician Assistant

## 2023-11-07 DIAGNOSIS — Z1231 Encounter for screening mammogram for malignant neoplasm of breast: Secondary | ICD-10-CM

## 2023-11-09 ENCOUNTER — Ambulatory Visit (HOSPITAL_COMMUNITY): Payer: Medicare Other

## 2023-11-10 ENCOUNTER — Ambulatory Visit (HOSPITAL_COMMUNITY)

## 2023-11-14 ENCOUNTER — Ambulatory Visit: Payer: Medicare Other | Admitting: Physician Assistant

## 2023-11-15 ENCOUNTER — Other Ambulatory Visit: Payer: Self-pay

## 2023-11-15 DIAGNOSIS — Z853 Personal history of malignant neoplasm of breast: Secondary | ICD-10-CM

## 2023-11-15 DIAGNOSIS — D509 Iron deficiency anemia, unspecified: Secondary | ICD-10-CM

## 2023-11-15 DIAGNOSIS — E559 Vitamin D deficiency, unspecified: Secondary | ICD-10-CM

## 2023-11-15 DIAGNOSIS — Z1231 Encounter for screening mammogram for malignant neoplasm of breast: Secondary | ICD-10-CM

## 2023-11-16 ENCOUNTER — Inpatient Hospital Stay: Payer: Medicare Other

## 2023-11-16 ENCOUNTER — Inpatient Hospital Stay: Payer: Medicare Other | Attending: Physician Assistant | Admitting: Oncology

## 2023-11-16 ENCOUNTER — Ambulatory Visit (HOSPITAL_COMMUNITY)
Admission: RE | Admit: 2023-11-16 | Discharge: 2023-11-16 | Disposition: A | Discharge status: Home / Self Care | Source: Ambulatory Visit | Attending: Physician Assistant | Admitting: Physician Assistant

## 2023-11-16 DIAGNOSIS — F5089 Other specified eating disorder: Secondary | ICD-10-CM | POA: Insufficient documentation

## 2023-11-16 DIAGNOSIS — C50212 Malignant neoplasm of upper-inner quadrant of left female breast: Secondary | ICD-10-CM | POA: Diagnosis not present

## 2023-11-16 DIAGNOSIS — R5383 Other fatigue: Secondary | ICD-10-CM | POA: Insufficient documentation

## 2023-11-16 DIAGNOSIS — Z833 Family history of diabetes mellitus: Secondary | ICD-10-CM | POA: Diagnosis not present

## 2023-11-16 DIAGNOSIS — D509 Iron deficiency anemia, unspecified: Secondary | ICD-10-CM | POA: Insufficient documentation

## 2023-11-16 DIAGNOSIS — M8580 Other specified disorders of bone density and structure, unspecified site: Secondary | ICD-10-CM | POA: Diagnosis not present

## 2023-11-16 DIAGNOSIS — Z79811 Long term (current) use of aromatase inhibitors: Secondary | ICD-10-CM | POA: Insufficient documentation

## 2023-11-16 DIAGNOSIS — R61 Generalized hyperhidrosis: Secondary | ICD-10-CM | POA: Diagnosis not present

## 2023-11-16 DIAGNOSIS — Z1231 Encounter for screening mammogram for malignant neoplasm of breast: Secondary | ICD-10-CM | POA: Insufficient documentation

## 2023-11-16 DIAGNOSIS — Z803 Family history of malignant neoplasm of breast: Secondary | ICD-10-CM | POA: Diagnosis not present

## 2023-11-16 DIAGNOSIS — R200 Anesthesia of skin: Secondary | ICD-10-CM | POA: Diagnosis not present

## 2023-11-16 DIAGNOSIS — Z853 Personal history of malignant neoplasm of breast: Secondary | ICD-10-CM

## 2023-11-16 DIAGNOSIS — Z8249 Family history of ischemic heart disease and other diseases of the circulatory system: Secondary | ICD-10-CM | POA: Insufficient documentation

## 2023-11-16 DIAGNOSIS — Z79899 Other long term (current) drug therapy: Secondary | ICD-10-CM | POA: Insufficient documentation

## 2023-11-16 DIAGNOSIS — R252 Cramp and spasm: Secondary | ICD-10-CM | POA: Diagnosis not present

## 2023-11-16 DIAGNOSIS — R351 Nocturia: Secondary | ICD-10-CM | POA: Diagnosis not present

## 2023-11-16 DIAGNOSIS — E559 Vitamin D deficiency, unspecified: Secondary | ICD-10-CM

## 2023-11-16 LAB — COMPREHENSIVE METABOLIC PANEL WITH GFR
ALT: 64 U/L — ABNORMAL HIGH (ref 0–44)
AST: 97 U/L — ABNORMAL HIGH (ref 15–41)
Albumin: 4.2 g/dL (ref 3.5–5.0)
Alkaline Phosphatase: 76 U/L (ref 38–126)
Anion gap: 14 (ref 5–15)
BUN: 14 mg/dL (ref 8–23)
CO2: 25 mmol/L (ref 22–32)
Calcium: 9.6 mg/dL (ref 8.9–10.3)
Chloride: 99 mmol/L (ref 98–111)
Creatinine, Ser: 0.72 mg/dL (ref 0.44–1.00)
GFR, Estimated: >60 mL/min (ref >60)
Glucose, Bld: 148 mg/dL — ABNORMAL HIGH (ref 70–99)
Potassium: 4.1 mmol/L (ref 3.5–5.1)
Sodium: 138 mmol/L (ref 135–145)
Total Bilirubin: 0.8 mg/dL (ref 0.0–1.2)
Total Protein: 8 g/dL (ref 6.5–8.1)

## 2023-11-16 LAB — CBC WITH DIFFERENTIAL/PLATELET
Abs Immature Granulocytes: 0.04 K/uL (ref 0.00–0.07)
Basophils Absolute: 0.1 K/uL (ref 0.0–0.1)
Basophils Relative: 1 %
Eosinophils Absolute: 0.1 K/uL (ref 0.0–0.5)
Eosinophils Relative: 1 %
HCT: 41 % (ref 36.0–46.0)
Hemoglobin: 13.6 g/dL (ref 12.0–15.0)
Immature Granulocytes: 0 %
Lymphocytes Relative: 24 %
Lymphs Abs: 2.3 K/uL (ref 0.7–4.0)
MCH: 29.7 pg (ref 26.0–34.0)
MCHC: 33.2 g/dL (ref 30.0–36.0)
MCV: 89.5 fL (ref 80.0–100.0)
Monocytes Absolute: 0.6 K/uL (ref 0.1–1.0)
Monocytes Relative: 7 %
Neutro Abs: 6.4 K/uL (ref 1.7–7.7)
Neutrophils Relative %: 67 %
Platelets: 284 K/uL (ref 150–400)
RBC: 4.58 MIL/uL (ref 3.87–5.11)
RDW: 14.4 % (ref 11.5–15.5)
WBC: 9.6 K/uL (ref 4.0–10.5)
nRBC: 0 % (ref 0.0–0.2)

## 2023-11-16 LAB — IRON AND TIBC
Iron: 57 ug/dL (ref 28–170)
Saturation Ratios: 13 % (ref 10.4–31.8)
TIBC: 449 ug/dL (ref 250–450)
UIBC: 392 ug/dL

## 2023-11-16 LAB — FERRITIN: Ferritin: 74 ng/mL (ref 11–307)

## 2023-11-16 LAB — VITAMIN D 25 HYDROXY (VIT D DEFICIENCY, FRACTURES): Vit D, 25-Hydroxy: 51.17 ng/mL (ref 30–100)

## 2023-11-16 NOTE — Progress Notes (Signed)
 [facility] [address], [place] [address]   CLINIC:  Medical Oncology/Hematology  PCP:  Laura Elspeth BRAVO, MD [address] [place] [address] [phone]   REASON FOR VISIT:  Follow-up for left breast cancer   PRIOR THERAPY: Lumpectomy and sentinel lymph node biopsy (10/28/2016); radiation   CURRENT THERAPY: Anastrozole  (started July 2018)  BRIEF ONCOLOGIC HISTORY:   Oncology History  History of left breast cancer  06/28/2018 Initial Diagnosis   History of left breast cancer   11/24/2020 Genetic Testing   BCI Testing Results:       CANCER STAGING:  Cancer Staging  No matching staging information was found for the patient.   INTERVAL HISTORY:   Laura Oliver, a 73 y.o. female, returns for routine follow-up of her history of left-sided breast cancer, osteopenia, and iron deficiency anemia. [name] was last seen in person on 11/12/2022.  She had a mammogram done today and results are pending.  At today's visit, she  reports feeling well.   She denies any recent hospitalizations, surgeries, or changes in her  baseline health status.  She denies any symptoms of recurrence such as new lumps, bone pain, chest pain, dyspnea, or abdominal pain.  She has no new headaches, seizures, or focal neurologic deficits.   No B symptoms such as fever, chills, unintentional weight loss.  She does not have any lymphedema of her affected side (left side).    She finished her last dose of Arimidex  on 10/10/2022.  Night sweats have resolved after she stopped Arimidex . She discontinued Fosamax  last year.  She has been taking iron tablet (ferrous sulfate) daily and is tolerating her iron tablet well.  Patient denies any bright red blood per rectum or melena.   She reports that her energy is at baseline, with some mild chronic fatigue.  Eyes ice pica.  She has leg cramps, but these have improved.  No headaches, lightheadedness, or syncope.   She reports 75% energy and  100% appetite.  She is maintaining stable weight at this time.   ASSESSMENT & PLAN:  1. Left breast cancer: - Abnormal mammogram on April 2018. - Biopsy on 09/30/2016 of the left breast at 12 o'clock position.  Consistent with moderately differentiated IDC, ER/PR positive, HER-2 negative. - MRI showed 11 o'clock position left breast, 1.6 x 1.4 x 2.4 cm irregularly bordered enhancing mass. - She underwent left lumpectomy and sentinel lymph node biopsy on 10/28/2016. - Pathology showed moderately differentiated IDC, 0.9 cm associated with prior biopsy site.  An incidental well-differentiated IDC, 0.4 cm located about 5 mm posterior to the first lesion.  Low-grade DCIS was seen.  3 sentinel lymph nodes were negative.  Margins were negative. - She received 16 radiation treatments at [facility] and in [place] . - Anastrozole  from July 2018 - June 2024 (6 years) BCI testing was 2.4% risk of late distant recurrence, with NO evidence of benefit from prolonged antiestrogen therapy  - FAMILY HISTORY notable for patient's daughter having metastatic breast cancer - Last mammogram (11/16/23): Results are pending. -Abdominal US  was checked in July 2023 due to transaminitis - no signs of metastatic disease, findings consistent with fatty liver disease and benign liver cyst - Most recent labs (11/16/23): CBC essentially normal.  CMP shows persistent transaminitis AST is 97 and ALT 64.   -Will call with results from mammogram.  Exam deferred. -- Repeat labs and mammogram in July 2026  -- Office visit  in July 2026, after labs   2.  Osteopenia: - Bone density test on 11/17/2016 showed T score of -2.4. - She was started on Fosamax  70 mg weekly in 2018, by her PMD in [place] . She is tolerating it well.  Stopped Fosamax  about a year ago. - DEXA scan on 11/20/2018 showed a T score of -2.2. - Most recent DEXA scan (11/05/2022): T score -2.2 - Most recent vitamin D  level (11/16/23): Pending.  -  Discussed with patient the need to continue on calcium and vitamin D  supplementation.  Encouraged weightbearing exercises to strengthen bones.   3.  Iron deficiency anemia - Noted per labs from 10/29/2021 with Hgb 11.0 and borderline MCV 80.0 - Labs from 11/05/2021 show iron deficiency with ferritin 17, iron saturation 7%, TIBC 563.  B12 and folate normal.  SPEP negative. - Hemoccult stool positive - 1 out of 3 samples (July 2023) - She had an upper endoscopy and colonoscopy on 12/02/23 for Hemoccult stool positive.  Colonoscopy showed several polyps (4) which were tubular adenomas but no evidence of dysplasia or carcinoma. - She has never had any blood transfusions. - She has been taking iron tablet daily since July 2023 (taken at different time of day than PPI) - Most recent labs (11/16/23): Hgb 13.6/MCV 89.5, ferritin 74, iron saturation 13% with TIBC 449 -- Patient denies any bright red blood per rectum or melena. - Mild fatigue with energy at baseline.  - Continue iron tablets daily  PLAN SUMMARY: >> Mammogram in 1 year. >> Labs in July 2026 = CBC/D, CMP, Vitamin D , ferritin, iron/TIBC >> OFFICE visit July 2026 (1 week after labs/mammogram)    REVIEW OF SYSTEMS:   Review of Systems  Constitutional:  Negative for appetite change, fatigue, fever and unexpected weight change.  HENT:   Negative for nosebleeds, sore throat and trouble swallowing.   Eyes: Negative.   Respiratory: Negative.  Negative for cough, shortness of breath and wheezing.   Cardiovascular: Negative.  Negative for chest pain and leg swelling.  Gastrointestinal:  Negative for abdominal pain, blood in stool, constipation, diarrhea, nausea and vomiting.  Endocrine: Negative.   Genitourinary:  Positive for nocturia. Negative for bladder incontinence and hematuria.   Musculoskeletal: Negative.  Negative for back pain and flank pain.  Skin: Negative.   Neurological:  Positive for numbness. Negative for dizziness, headaches and  light-headedness.  Hematological: Negative.   Psychiatric/Behavioral: Negative.  Negative for confusion. The patient is not nervous/anxious.     PHYSICAL EXAM:   Performance status (ECOG): 0 - Asymptomatic  Vitals:   11/16/23 0957  BP: 128/76  Pulse: 99  Resp: 18  Temp: 98.2 F (36.8 C)  SpO2: 98%   Wt Readings from Last 3 Encounters:  11/16/23 168 lb 9.6 oz (76.5 kg)  12/02/22 165 lb 9.1 oz (75.1 kg)  11/30/22 165 lb 9.6 oz (75.1 kg)   Physical Exam Constitutional:      Appearance: Normal appearance.  Chest:     Comments: Deferred- Mammogram today.  Abdominal:     General: Bowel sounds are normal.     Palpations: Abdomen is soft.  Musculoskeletal:        General: No swelling. Normal range of motion.  Neurological:     Mental Status: She is alert and oriented to person, place, and time. Mental status is at baseline.      PAST MEDICAL/SURGICAL HISTORY:  Past Medical History:  Diagnosis Date   Breast cancer (HCC) 10/28/2016   left  Diabetes mellitus without complication (HCC)    GERD (gastroesophageal reflux disease)    High cholesterol    Hypertension    Past Surgical History:  Procedure Laterality Date   ANKLE ARTHROPLASTY Left 2012   BREAST LUMPECTOMY Left 10/28/2016   COLONOSCOPY WITH PROPOFOL  N/A 12/02/2022   Procedure: COLONOSCOPY WITH PROPOFOL ;  Surgeon: Cinderella Deatrice FALCON, MD;  [facility]: [facility];  Service: Endoscopy;  Laterality: N/A;  7:30am;asa 3   ESOPHAGOGASTRODUODENOSCOPY (EGD) WITH PROPOFOL  N/A 12/02/2022   Procedure: ESOPHAGOGASTRODUODENOSCOPY (EGD) WITH PROPOFOL ;  Surgeon: Cinderella Deatrice FALCON, MD;  Location: [facility];  Service: Endoscopy;  Laterality: N/A;  7:30am;asa 3   POLYPECTOMY  12/02/2022   Procedure: POLYPECTOMY;  Surgeon: Cinderella Deatrice FALCON, MD;  Location: [facility];  Service: Endoscopy;;   TUBAL LIGATION      SOCIAL HISTORY:  Social History   Socioeconomic History   Marital status: Married    Spouse name: Not on file    Number of children: Not on file   Years of education: Not on file   Highest education level: Not on file  Occupational History   Occupation: retired  Tobacco Use   Smoking status: Never   Smokeless tobacco: Never  Vaping Use   Vaping status: Never Used  Substance and Sexual Activity   Alcohol  use: Yes    Comment: prn beer   Drug use: Never   Sexual activity: Not on file  Other Topics Concern   Not on file  Social History Narrative   From [place]   Recently retired and moved to [place] to be closer to her family.    Social Drivers of Corporate investment banker Strain: Low Risk  (03/01/2018)   Overall Financial Resource Strain (CARDIA)    Difficulty of Paying Living Expenses: Not hard at all  Food Insecurity: No Food Insecurity (03/01/2018)   Hunger Vital Sign    Worried About Running Out of Food in the Last Year: Never true    Ran Out of Food in the Last Year: Never true  Transportation Needs: No Transportation Needs (03/01/2018)   PRAPARE - Administrator, Civil Service (Medical): No    Lack of Transportation (Non-Medical): No  Physical Activity: Not on file  Stress: No Stress Concern Present (03/01/2018)   [employer] of Occupational Health - Occupational Stress Questionnaire    Feeling of Stress : Not at all  Social Connections: Unknown (03/01/2018)   Social Connection and Isolation Panel    Frequency of Communication with Friends and Family: Not asked    Frequency of Social Gatherings with Friends and Family: Not on file    Attends Religious Services: Not on file    Active Member of Clubs or Organizations: Not on file    Attends Banker Meetings: Not on file    Marital Status: Not on file  Intimate Partner Violence: Not on file    FAMILY HISTORY:  Family History  Problem Relation Age of Onset   Cancer Daughter        breast   Rheumatic fever Father    Diabetes Brother    Hypertension Brother     CURRENT MEDICATIONS:   Current Outpatient Medications  Medication Sig Dispense Refill   ACCU-CHEK GUIDE test strip daily. for testing     amLODipine (NORVASC) 10 MG tablet Take 10 mg by mouth at bedtime.     Calcium Carbonate (CALTRATE 600 PO) Take 1 tablet by mouth.  Cholecalciferol (VITAMIN D3) 2000 units TABS Take 1 tablet by mouth.     ezetimibe-simvastatin (VYTORIN) 10-40 MG tablet Take 1 tablet by mouth daily.     ferrous sulfate 324 MG TBEC Take 324 mg by mouth at bedtime as needed.     metFORMIN (GLUCOPHAGE) 1000 MG tablet Take 1,000 mg by mouth 2 (two) times daily.     metoprolol tartrate (LOPRESSOR) 25 MG tablet Take 25 mg by mouth 2 (two) times daily.     Multiple Vitamins-Minerals (WOMENS MULTI VITAMIN & MINERAL PO) Take 1 tablet by mouth.     omeprazole (PRILOSEC) 20 MG capsule Take 20 mg by mouth daily.     OVER THE COUNTER MEDICATION Magnesium daily     Sodium Sulfate-Mag Sulfate-KCl (SUTAB ) 1479-225-188 MG TABS As directed 24 tablet 0   valsartan-hydrochlorothiazide (DIOVAN-HCT) 320-25 MG tablet TAKE 1 TABLET BY MOUTH EVERY DAY FOR BLOOD PRESSURE     No current facility-administered medications for this visit.    ALLERGIES:  No Known Allergies  LABORATORY DATA:  I have reviewed the labs as listed.     Latest Ref Rng & Units 11/16/2023    8:59 AM 11/05/2022   12:10 PM 08/12/2022    2:15 PM  CBC  WBC 4.0 - 10.5 K/uL 9.6  9.9  10.1   Hemoglobin 12.0 - 15.0 g/dL 86.3  86.0  86.1   Hematocrit 36.0 - 46.0 % 41.0  42.5  42.0   Platelets 150 - 400 K/uL 284  292  282       Latest Ref Rng & Units 11/16/2023    8:59 AM 11/17/2022    9:13 AM 11/05/2022   12:10 PM  CMP  Glucose 70 - 99 mg/dL 851  897  891   BUN 8 - 23 mg/dL 14  17  20    Creatinine 0.44 - 1.00 mg/dL 9.27  9.20  9.31   Sodium 135 - 145 mmol/L 138  141  138   Potassium 3.5 - 5.1 mmol/L 4.1  4.6  4.1   Chloride 98 - 111 mmol/L 99  99  102   CO2 22 - 32 mmol/L 25  24  25    Calcium 8.9 - 10.3 mg/dL 9.6  89.9  9.2   Total Protein  6.5 - 8.1 g/dL 8.0  7.8  8.4   Total Bilirubin 0.0 - 1.2 mg/dL 0.8  0.5  0.4   Alkaline Phos 38 - 126 U/L 76  87  75   AST 15 - 41 U/L 97  142  115   ALT 0 - 44 U/L 64  84  73     DIAGNOSTIC IMAGING:  I have independently reviewed the scans and discussed with the patient. No results found.   WRAP UP:  All questions were answered. The patient knows to call the clinic with any problems, questions or concerns.  Medical decision making: Moderate  Time spent on visit: I spent 20 minutes dedicated to the care of this patient (face-to-face and non-face-to-face) on the date of the encounter to include what is described in the assessment and plan.  Delon FORBES Hope, NP  11/16/23 10:10 AM

## 2023-11-21 ENCOUNTER — Other Ambulatory Visit (HOSPITAL_COMMUNITY): Payer: Self-pay | Admitting: Physician Assistant

## 2023-11-21 DIAGNOSIS — R928 Other abnormal and inconclusive findings on diagnostic imaging of breast: Secondary | ICD-10-CM

## 2023-11-22 ENCOUNTER — Ambulatory Visit (HOSPITAL_COMMUNITY)
Admission: RE | Admit: 2023-11-22 | Discharge: 2023-11-22 | Disposition: A | Discharge status: Home / Self Care | Source: Ambulatory Visit | Attending: Physician Assistant | Admitting: Physician Assistant

## 2023-11-22 ENCOUNTER — Ambulatory Visit (HOSPITAL_COMMUNITY)
Admission: RE | Admit: 2023-11-22 | Discharge: 2023-11-22 | Discharge status: Home / Self Care | Source: Ambulatory Visit | Attending: Physician Assistant

## 2023-11-22 ENCOUNTER — Encounter (HOSPITAL_COMMUNITY): Payer: Self-pay

## 2023-11-22 ENCOUNTER — Other Ambulatory Visit (HOSPITAL_COMMUNITY): Payer: Self-pay | Admitting: Physician Assistant

## 2023-11-22 DIAGNOSIS — R928 Other abnormal and inconclusive findings on diagnostic imaging of breast: Secondary | ICD-10-CM

## 2023-11-22 DIAGNOSIS — R921 Mammographic calcification found on diagnostic imaging of breast: Secondary | ICD-10-CM | POA: Diagnosis not present

## 2023-11-22 DIAGNOSIS — Z853 Personal history of malignant neoplasm of breast: Secondary | ICD-10-CM | POA: Diagnosis not present

## 2023-11-22 DIAGNOSIS — N6312 Unspecified lump in the right breast, upper inner quadrant: Secondary | ICD-10-CM | POA: Diagnosis not present

## 2023-11-22 DIAGNOSIS — R92323 Mammographic fibroglandular density, bilateral breasts: Secondary | ICD-10-CM | POA: Diagnosis not present

## 2023-11-25 ENCOUNTER — Other Ambulatory Visit (HOSPITAL_COMMUNITY): Payer: Self-pay | Admitting: Physician Assistant

## 2023-11-25 DIAGNOSIS — R928 Other abnormal and inconclusive findings on diagnostic imaging of breast: Secondary | ICD-10-CM

## 2023-11-29 ENCOUNTER — Ambulatory Visit (HOSPITAL_COMMUNITY)
Admission: RE | Admit: 2023-11-29 | Discharge: 2023-11-29 | Disposition: A | Discharge status: Home / Self Care | Source: Ambulatory Visit | Attending: Physician Assistant

## 2023-11-29 ENCOUNTER — Ambulatory Visit (HOSPITAL_COMMUNITY)
Admission: RE | Admit: 2023-11-29 | Discharge: 2023-11-29 | Disposition: A | Discharge status: Home / Self Care | Source: Ambulatory Visit | Attending: Physician Assistant | Admitting: Physician Assistant

## 2023-11-29 DIAGNOSIS — R928 Other abnormal and inconclusive findings on diagnostic imaging of breast: Secondary | ICD-10-CM

## 2023-11-29 DIAGNOSIS — N6310 Unspecified lump in the right breast, unspecified quadrant: Secondary | ICD-10-CM | POA: Diagnosis not present

## 2023-11-29 DIAGNOSIS — Z853 Personal history of malignant neoplasm of breast: Secondary | ICD-10-CM | POA: Diagnosis not present

## 2023-11-29 DIAGNOSIS — N641 Fat necrosis of breast: Secondary | ICD-10-CM | POA: Diagnosis not present

## 2023-11-29 DIAGNOSIS — N6312 Unspecified lump in the right breast, upper inner quadrant: Secondary | ICD-10-CM | POA: Diagnosis not present

## 2023-11-29 HISTORY — PX: BREAST BIOPSY: SHX20

## 2023-11-29 MED ORDER — LIDOCAINE-EPINEPHRINE (PF) 1 %-1:200000 IJ SOLN
10.0000 mL | Freq: Once | INTRAMUSCULAR | Status: AC
  Administered 2023-11-29: 10 mL via INTRADERMAL

## 2023-11-29 MED ORDER — LIDOCAINE HCL (PF) 2 % IJ SOLN
INTRAMUSCULAR | Status: AC
  Filled 2023-11-29: qty 10

## 2023-11-29 MED ORDER — LIDOCAINE HCL (PF) 2 % IJ SOLN
10.0000 mL | Freq: Once | INTRAMUSCULAR | Status: AC
  Administered 2023-11-29: 10 mL

## 2023-11-29 MED ORDER — LIDOCAINE-EPINEPHRINE (PF) 1 %-1:200000 IJ SOLN
INTRAMUSCULAR | Status: AC
  Filled 2023-11-29: qty 30

## 2023-11-30 LAB — SURGICAL PATHOLOGY

## 2023-12-12 DIAGNOSIS — E119 Type 2 diabetes mellitus without complications: Secondary | ICD-10-CM | POA: Diagnosis not present

## 2023-12-12 DIAGNOSIS — E7849 Other hyperlipidemia: Secondary | ICD-10-CM | POA: Diagnosis not present

## 2023-12-12 DIAGNOSIS — E7800 Pure hypercholesterolemia, unspecified: Secondary | ICD-10-CM | POA: Diagnosis not present

## 2023-12-12 DIAGNOSIS — Z1321 Encounter for screening for nutritional disorder: Secondary | ICD-10-CM | POA: Diagnosis not present

## 2023-12-12 DIAGNOSIS — E1169 Type 2 diabetes mellitus with other specified complication: Secondary | ICD-10-CM | POA: Diagnosis not present

## 2023-12-12 DIAGNOSIS — I10 Essential (primary) hypertension: Secondary | ICD-10-CM | POA: Diagnosis not present

## 2023-12-21 DIAGNOSIS — R053 Chronic cough: Secondary | ICD-10-CM | POA: Diagnosis not present

## 2023-12-21 DIAGNOSIS — E782 Mixed hyperlipidemia: Secondary | ICD-10-CM | POA: Diagnosis not present

## 2023-12-21 DIAGNOSIS — E119 Type 2 diabetes mellitus without complications: Secondary | ICD-10-CM | POA: Diagnosis not present

## 2023-12-21 DIAGNOSIS — I10 Essential (primary) hypertension: Secondary | ICD-10-CM | POA: Diagnosis not present

## 2023-12-21 DIAGNOSIS — D126 Benign neoplasm of colon, unspecified: Secondary | ICD-10-CM | POA: Diagnosis not present

## 2023-12-21 DIAGNOSIS — Z6831 Body mass index (BMI) 31.0-31.9, adult: Secondary | ICD-10-CM | POA: Diagnosis not present

## 2024-03-20 DIAGNOSIS — Z23 Encounter for immunization: Secondary | ICD-10-CM | POA: Diagnosis not present

## 2024-11-21 ENCOUNTER — Other Ambulatory Visit

## 2024-11-28 ENCOUNTER — Ambulatory Visit: Admitting: Oncology
